# Patient Record
Sex: Male | Born: 1950 | Race: White | Hispanic: No | Marital: Single | Smoking: Current every day smoker
Health system: Southern US, Community
[De-identification: ages and names within clinical notes are randomized; demographics above are authoritative.]

## PROBLEM LIST (undated history)

## (undated) DIAGNOSIS — F32A Depression, unspecified: Secondary | ICD-10-CM

## (undated) DIAGNOSIS — F329 Major depressive disorder, single episode, unspecified: Secondary | ICD-10-CM

## (undated) DIAGNOSIS — J449 Chronic obstructive pulmonary disease, unspecified: Secondary | ICD-10-CM

## (undated) DIAGNOSIS — R0600 Dyspnea, unspecified: Secondary | ICD-10-CM

## (undated) DIAGNOSIS — G5603 Carpal tunnel syndrome, bilateral upper limbs: Secondary | ICD-10-CM

## (undated) DIAGNOSIS — F419 Anxiety disorder, unspecified: Secondary | ICD-10-CM

## (undated) DIAGNOSIS — R251 Tremor, unspecified: Secondary | ICD-10-CM

## (undated) DIAGNOSIS — R519 Headache, unspecified: Secondary | ICD-10-CM

## (undated) DIAGNOSIS — C719 Malignant neoplasm of brain, unspecified: Secondary | ICD-10-CM

## (undated) DIAGNOSIS — C3490 Malignant neoplasm of unspecified part of unspecified bronchus or lung: Secondary | ICD-10-CM

## (undated) DIAGNOSIS — M1990 Unspecified osteoarthritis, unspecified site: Secondary | ICD-10-CM

## (undated) DIAGNOSIS — Z9981 Dependence on supplemental oxygen: Secondary | ICD-10-CM

## (undated) DIAGNOSIS — R51 Headache: Secondary | ICD-10-CM

## (undated) DIAGNOSIS — R569 Unspecified convulsions: Secondary | ICD-10-CM

## (undated) DIAGNOSIS — K589 Irritable bowel syndrome without diarrhea: Secondary | ICD-10-CM

## (undated) DIAGNOSIS — R42 Dizziness and giddiness: Secondary | ICD-10-CM

## (undated) DIAGNOSIS — K219 Gastro-esophageal reflux disease without esophagitis: Secondary | ICD-10-CM

## (undated) DIAGNOSIS — K5790 Diverticulosis of intestine, part unspecified, without perforation or abscess without bleeding: Secondary | ICD-10-CM

## (undated) DIAGNOSIS — G629 Polyneuropathy, unspecified: Secondary | ICD-10-CM

## (undated) DIAGNOSIS — E538 Deficiency of other specified B group vitamins: Secondary | ICD-10-CM

## (undated) DIAGNOSIS — G4730 Sleep apnea, unspecified: Secondary | ICD-10-CM

## (undated) HISTORY — PX: BACK SURGERY: SHX140

## (undated) HISTORY — PX: TONSILLECTOMY: SUR1361

---

## 1999-02-22 DIAGNOSIS — R569 Unspecified convulsions: Secondary | ICD-10-CM

## 1999-02-22 HISTORY — DX: Unspecified convulsions: R56.9

## 2005-04-14 ENCOUNTER — Ambulatory Visit: Payer: Self-pay | Admitting: Family Medicine

## 2005-06-13 ENCOUNTER — Ambulatory Visit: Payer: Self-pay | Admitting: Gastroenterology

## 2006-06-19 ENCOUNTER — Ambulatory Visit: Payer: Self-pay | Admitting: Family Medicine

## 2006-09-08 ENCOUNTER — Ambulatory Visit: Payer: Self-pay | Admitting: Specialist

## 2007-12-04 ENCOUNTER — Ambulatory Visit: Payer: Self-pay | Admitting: Internal Medicine

## 2008-04-21 ENCOUNTER — Ambulatory Visit: Payer: Self-pay | Admitting: Internal Medicine

## 2008-05-26 ENCOUNTER — Ambulatory Visit: Payer: Self-pay | Admitting: Specialist

## 2008-09-22 ENCOUNTER — Ambulatory Visit: Payer: Self-pay | Admitting: Specialist

## 2009-03-17 ENCOUNTER — Ambulatory Visit: Payer: Self-pay | Admitting: Specialist

## 2009-08-21 ENCOUNTER — Ambulatory Visit: Payer: Self-pay | Admitting: Specialist

## 2009-11-27 ENCOUNTER — Ambulatory Visit: Payer: Self-pay | Admitting: Specialist

## 2010-06-15 ENCOUNTER — Ambulatory Visit: Payer: Self-pay | Admitting: Specialist

## 2016-10-02 ENCOUNTER — Encounter: Payer: Self-pay | Admitting: Emergency Medicine

## 2016-10-02 ENCOUNTER — Inpatient Hospital Stay: Payer: Medicare Other

## 2016-10-02 ENCOUNTER — Inpatient Hospital Stay
Admission: EM | Admit: 2016-10-02 | Discharge: 2016-10-05 | DRG: 189 | Disposition: A | Discharge status: Home / Self Care | Payer: Medicare Other | Attending: Internal Medicine | Admitting: Internal Medicine

## 2016-10-02 ENCOUNTER — Emergency Department: Payer: Medicare Other

## 2016-10-02 DIAGNOSIS — M549 Dorsalgia, unspecified: Secondary | ICD-10-CM | POA: Diagnosis present

## 2016-10-02 DIAGNOSIS — Z66 Do not resuscitate: Secondary | ICD-10-CM | POA: Diagnosis present

## 2016-10-02 DIAGNOSIS — Z820 Family history of epilepsy and other diseases of the nervous system: Secondary | ICD-10-CM

## 2016-10-02 DIAGNOSIS — K5900 Constipation, unspecified: Secondary | ICD-10-CM | POA: Diagnosis present

## 2016-10-02 DIAGNOSIS — G8929 Other chronic pain: Secondary | ICD-10-CM | POA: Diagnosis present

## 2016-10-02 DIAGNOSIS — Z515 Encounter for palliative care: Secondary | ICD-10-CM

## 2016-10-02 DIAGNOSIS — G4700 Insomnia, unspecified: Secondary | ICD-10-CM | POA: Diagnosis present

## 2016-10-02 DIAGNOSIS — J441 Chronic obstructive pulmonary disease with (acute) exacerbation: Secondary | ICD-10-CM | POA: Diagnosis present

## 2016-10-02 DIAGNOSIS — N400 Enlarged prostate without lower urinary tract symptoms: Secondary | ICD-10-CM | POA: Diagnosis present

## 2016-10-02 DIAGNOSIS — Z79899 Other long term (current) drug therapy: Secondary | ICD-10-CM

## 2016-10-02 DIAGNOSIS — R042 Hemoptysis: Secondary | ICD-10-CM | POA: Diagnosis present

## 2016-10-02 DIAGNOSIS — Z6826 Body mass index (BMI) 26.0-26.9, adult: Secondary | ICD-10-CM

## 2016-10-02 DIAGNOSIS — R079 Chest pain, unspecified: Secondary | ICD-10-CM

## 2016-10-02 DIAGNOSIS — F17210 Nicotine dependence, cigarettes, uncomplicated: Secondary | ICD-10-CM | POA: Diagnosis present

## 2016-10-02 DIAGNOSIS — Z880 Allergy status to penicillin: Secondary | ICD-10-CM

## 2016-10-02 DIAGNOSIS — Z7189 Other specified counseling: Secondary | ICD-10-CM

## 2016-10-02 DIAGNOSIS — E43 Unspecified severe protein-calorie malnutrition: Secondary | ICD-10-CM | POA: Diagnosis present

## 2016-10-02 DIAGNOSIS — R918 Other nonspecific abnormal finding of lung field: Secondary | ICD-10-CM

## 2016-10-02 DIAGNOSIS — Z7982 Long term (current) use of aspirin: Secondary | ICD-10-CM

## 2016-10-02 DIAGNOSIS — F329 Major depressive disorder, single episode, unspecified: Secondary | ICD-10-CM | POA: Diagnosis present

## 2016-10-02 DIAGNOSIS — J9601 Acute respiratory failure with hypoxia: Principal | ICD-10-CM | POA: Diagnosis present

## 2016-10-02 HISTORY — DX: Depression, unspecified: F32.A

## 2016-10-02 HISTORY — DX: Major depressive disorder, single episode, unspecified: F32.9

## 2016-10-02 HISTORY — DX: Chronic obstructive pulmonary disease, unspecified: J44.9

## 2016-10-02 LAB — BASIC METABOLIC PANEL
ANION GAP: 10 (ref 5–15)
BUN: 10 mg/dL (ref 6–20)
CALCIUM: 9.4 mg/dL (ref 8.9–10.3)
CHLORIDE: 90 mmol/L — AB (ref 101–111)
CO2: 34 mmol/L — AB (ref 22–32)
CREATININE: 0.61 mg/dL (ref 0.61–1.24)
GFR calc non Af Amer: >60 mL/min (ref >60)
Glucose, Bld: 102 mg/dL — ABNORMAL HIGH (ref 65–99)
Potassium: 3.8 mmol/L (ref 3.5–5.1)
SODIUM: 134 mmol/L — AB (ref 135–145)

## 2016-10-02 LAB — TROPONIN I

## 2016-10-02 LAB — CBC
HCT: 47.8 % (ref 40.0–52.0)
HEMOGLOBIN: 16.3 g/dL (ref 13.0–18.0)
MCH: 31.5 pg (ref 26.0–34.0)
MCHC: 34 g/dL (ref 32.0–36.0)
MCV: 92.7 fL (ref 80.0–100.0)
PLATELETS: 299 10*3/uL (ref 150–440)
RBC: 5.16 MIL/uL (ref 4.40–5.90)
RDW: 13.5 % (ref 11.5–14.5)
WBC: 7.7 10*3/uL (ref 3.8–10.6)

## 2016-10-02 MED ORDER — ACETAMINOPHEN 325 MG PO TABS: 650.0000 mg | ORAL_TABLET | Freq: Four times a day (QID) | ORAL | Status: DC | PRN

## 2016-10-02 MED ORDER — DULOXETINE HCL 30 MG PO CPEP
60.0000 mg | ORAL_CAPSULE | Freq: Every day | ORAL | Status: DC
  Administered 2016-10-02 – 2016-10-05 (×4): 60 mg via ORAL
  Filled 2016-10-02 (×4): qty 2

## 2016-10-02 MED ORDER — IPRATROPIUM-ALBUTEROL 0.5-2.5 (3) MG/3ML IN SOLN
3.0000 mL | Freq: Four times a day (QID) | RESPIRATORY_TRACT | Status: DC
  Administered 2016-10-02 – 2016-10-03 (×3): 3 mL via RESPIRATORY_TRACT
  Filled 2016-10-02 (×3): qty 3

## 2016-10-02 MED ORDER — IOPAMIDOL (ISOVUE-370) INJECTION 76%
75.0000 mL | Freq: Once | INTRAVENOUS | Status: AC | PRN
  Administered 2016-10-02: 75 mL via INTRAVENOUS

## 2016-10-02 MED ORDER — ONDANSETRON HCL 4 MG/2ML IJ SOLN: 4.0000 mg | Freq: Four times a day (QID) | INTRAMUSCULAR | Status: DC | PRN

## 2016-10-02 MED ORDER — METHYLPREDNISOLONE SODIUM SUCC 40 MG IJ SOLR
40.0000 mg | Freq: Four times a day (QID) | INTRAMUSCULAR | Status: DC
  Administered 2016-10-02 – 2016-10-04 (×7): 40 mg via INTRAVENOUS
  Filled 2016-10-02 (×7): qty 1

## 2016-10-02 MED ORDER — NICOTINE 21 MG/24HR TD PT24
21.0000 mg | MEDICATED_PATCH | Freq: Every day | TRANSDERMAL | Status: DC
  Administered 2016-10-02 – 2016-10-05 (×4): 21 mg via TRANSDERMAL
  Filled 2016-10-02 (×4): qty 1

## 2016-10-02 MED ORDER — MELOXICAM 7.5 MG PO TABS
7.5000 mg | ORAL_TABLET | Freq: Every day | ORAL | Status: DC
  Administered 2016-10-03 – 2016-10-05 (×4): 7.5 mg via ORAL
  Filled 2016-10-02 (×4): qty 1

## 2016-10-02 MED ORDER — MAGNESIUM SULFATE 2 GM/50ML IV SOLN
2.0000 g | Freq: Once | INTRAVENOUS | Status: AC
  Administered 2016-10-02: 2 g via INTRAVENOUS
  Filled 2016-10-02: qty 50

## 2016-10-02 MED ORDER — BUDESONIDE 0.5 MG/2ML IN SUSP
0.5000 mg | Freq: Two times a day (BID) | RESPIRATORY_TRACT | Status: DC
  Administered 2016-10-02 – 2016-10-05 (×6): 0.5 mg via RESPIRATORY_TRACT
  Filled 2016-10-02 (×6): qty 2

## 2016-10-02 MED ORDER — ASPIRIN EC 81 MG PO TBEC
81.0000 mg | DELAYED_RELEASE_TABLET | Freq: Every day | ORAL | Status: DC
  Administered 2016-10-02 – 2016-10-05 (×4): 81 mg via ORAL
  Filled 2016-10-02 (×4): qty 1

## 2016-10-02 MED ORDER — ACETAMINOPHEN 650 MG RE SUPP: 650.0000 mg | Freq: Four times a day (QID) | RECTAL | Status: DC | PRN

## 2016-10-02 MED ORDER — LORAZEPAM 1 MG PO TABS
1.0000 mg | ORAL_TABLET | Freq: Every day | ORAL | Status: DC
  Administered 2016-10-03 – 2016-10-04 (×3): 1 mg via ORAL
  Filled 2016-10-02 (×3): qty 1

## 2016-10-02 MED ORDER — METHYLPREDNISOLONE SODIUM SUCC 125 MG IJ SOLR
125.0000 mg | Freq: Once | INTRAMUSCULAR | Status: AC
  Administered 2016-10-02: 125 mg via INTRAVENOUS
  Filled 2016-10-02: qty 2

## 2016-10-02 MED ORDER — LEVOFLOXACIN 750 MG PO TABS
750.0000 mg | ORAL_TABLET | Freq: Once | ORAL | Status: AC
  Administered 2016-10-02: 750 mg via ORAL
  Filled 2016-10-02: qty 1

## 2016-10-02 MED ORDER — OXYCODONE-ACETAMINOPHEN 5-325 MG PO TABS
1.0000 | ORAL_TABLET | Freq: Four times a day (QID) | ORAL | Status: DC | PRN
  Administered 2016-10-03 – 2016-10-05 (×5): 1 via ORAL
  Filled 2016-10-02 (×5): qty 1

## 2016-10-02 MED ORDER — IPRATROPIUM-ALBUTEROL 0.5-2.5 (3) MG/3ML IN SOLN
3.0000 mL | Freq: Once | RESPIRATORY_TRACT | Status: AC
  Administered 2016-10-02: 3 mL via RESPIRATORY_TRACT
  Filled 2016-10-02: qty 3

## 2016-10-02 MED ORDER — DOXAZOSIN MESYLATE 4 MG PO TABS
4.0000 mg | ORAL_TABLET | Freq: Every day | ORAL | Status: DC
  Administered 2016-10-02 – 2016-10-05 (×4): 4 mg via ORAL
  Filled 2016-10-02 (×4): qty 1

## 2016-10-02 MED ORDER — FINASTERIDE 5 MG PO TABS
5.0000 mg | ORAL_TABLET | Freq: Every day | ORAL | Status: DC
  Administered 2016-10-02 – 2016-10-05 (×4): 5 mg via ORAL
  Filled 2016-10-02 (×4): qty 1

## 2016-10-02 MED ORDER — ONDANSETRON HCL 4 MG PO TABS: 4.0000 mg | ORAL_TABLET | Freq: Four times a day (QID) | ORAL | Status: DC | PRN

## 2016-10-02 MED ORDER — ALBUTEROL SULFATE (2.5 MG/3ML) 0.083% IN NEBU
5.0000 mg | INHALATION_SOLUTION | Freq: Once | RESPIRATORY_TRACT | Status: AC
  Administered 2016-10-02: 5 mg via RESPIRATORY_TRACT
  Filled 2016-10-02: qty 6

## 2016-10-02 MED ORDER — FUROSEMIDE 10 MG/ML IJ SOLN
60.0000 mg | Freq: Once | INTRAMUSCULAR | Status: AC
  Administered 2016-10-02: 60 mg via INTRAVENOUS
  Filled 2016-10-02: qty 8

## 2016-10-02 MED ORDER — ENOXAPARIN SODIUM 40 MG/0.4ML ~~LOC~~ SOLN
40.0000 mg | SUBCUTANEOUS | Status: DC
  Administered 2016-10-02 – 2016-10-04 (×3): 40 mg via SUBCUTANEOUS
  Filled 2016-10-02 (×3): qty 0.4

## 2016-10-02 NOTE — Plan of Care (Signed)
Problem: Safety: Goal: Ability to remain free from injury will improve Outcome: Progressing On admission, 10/02/16 - pt has fallen more than 1 time in the last 6 months; High Fall Precautions

## 2016-10-02 NOTE — ED Triage Notes (Signed)
Pt c/o shortness of breath and intermittent chest pain for the past several days. Pt has hx of COPD and is a smoker. Pt c/o shortness of breath at rest and exertion. On arrival RA sats = 88%, placed on 3 L.

## 2016-10-02 NOTE — ED Notes (Signed)
ED Provider at bedside. 

## 2016-10-02 NOTE — ED Provider Notes (Signed)
Patient received in sign-out from Dr. Joni Fears.  Workup and evaluation pending laboratory evaluation.  Patient does have evidence of a persistent dyspnea speaking in short phrases with diminished air movement throughout. Does have some improvement after albuterol nebulizers. Patient will be given IV magnesium. Based on his shortness of breath with hypoxia do feel the patient will require admission to hospital.      Merlyn Lot, MD 10/02/16 [phone]

## 2016-10-02 NOTE — H&P (Signed)
[facility] NAME: Bradley Frederick    MR#:  [id number]  DATE OF BIRTH:  19-May-1950  DATE OF ADMISSION:  10/02/2016  PRIMARY CARE PHYSICIAN: Tracie Harrier, MD   REQUESTING/REFERRING PHYSICIAN: Dr. Merlyn Lot.  CHIEF COMPLAINT:   Chief Complaint  Patient presents with  . Shortness of Breath    HISTORY OF PRESENT ILLNESS:  Bradley Frederick  is a 66 y.o. male with a known history of COPD, depression, chronic back pain who presents to the hospital due to worsening shortness of breath cough. Patient said he's been having worsening shortness of breath and cough now for the past month progressively getting worse. He has significant shortness of breath even on minimal exertion at home now. He therefore came to the ER for further evaluation and was noted to be hypoxic with O2 sats in the mid 70s to 80s. He was noted to be in acute respiratory failure with hypoxia secondary to COPD exacerbation. Patient also admits to a cough which is productive intermittently with bloody sputum. He denies any night sweats or weight loss. He denies any previous history of tuberculosis. Given his worsening shortness of breath and hypoxemia hospitalist services were contacted further treatment and evaluation.  PAST MEDICAL HISTORY:   Past Medical History:  Diagnosis Date  . COPD (chronic obstructive pulmonary disease) ([place])   . Depression     PAST SURGICAL HISTORY:  History reviewed. No pertinent surgical history.  SOCIAL HISTORY:   Social History  Substance Use Topics  . Smoking status: Current Every Day Smoker    Packs/day: 2.00    Years: 50.00    Types: Cigarettes  . Smokeless tobacco: Not on file  . Alcohol use Yes     Comment: Socially     FAMILY HISTORY:   Family History  Problem Relation Age of Onset  . Dementia Mother   . Parkinson's disease Father     DRUG ALLERGIES:   Allergies  Allergen Reactions  . Penicillins     Childhood  reaction unknown reaction  .Has patient had a PCN reaction causing immediate rash, facial/tongue/throat swelling, SOB or lightheadedness with hypotension: Unknown Has patient had a PCN reaction causing severe rash involving mucus membranes or skin necrosis: Unknown Has patient had a PCN reaction that required hospitalization: Unknown Has patient had a PCN reaction occurring within the last 10 years: Unknown If all of the above answers are "NO", then may proceed with Cephalosporin use.     REVIEW OF SYSTEMS:   Review of Systems  Constitutional: Negative for fever and weight loss.  HENT: Negative for congestion, nosebleeds and tinnitus.   Eyes: Negative for blurred vision, double vision and redness.  Respiratory: Positive for cough, hemoptysis and shortness of breath.   Cardiovascular: Negative for chest pain, orthopnea, leg swelling and PND.  Gastrointestinal: Negative for abdominal pain, diarrhea, melena, nausea and vomiting.  Genitourinary: Negative for dysuria, hematuria and urgency.  Musculoskeletal: Negative for falls and joint pain.  Neurological: Negative for dizziness, tingling, sensory change, focal weakness, seizures, weakness and headaches.  Endo/Heme/Allergies: Negative for polydipsia. Does not bruise/bleed easily.  Psychiatric/Behavioral: Negative for depression and memory loss. The patient is not nervous/anxious.     MEDICATIONS AT HOME:   Prior to Admission medications   Medication Sig Start Date End Date Taking? Authorizing Provider  ADVAIR DISKUS 250-50 MCG/DOSE AEPB Inhale 1 puff into the lungs every 12 (twelve) hours. 09/27/16  Yes [provider]  aspirin EC 81  MG tablet Take 81 mg by mouth daily.   Yes [provider]  doxazosin (CARDURA) 4 MG tablet Take 4 mg by mouth daily. 08/31/16  Yes [provider]  DULoxetine (CYMBALTA) 60 MG capsule Take 60 mg by mouth daily. 07/12/16  Yes [provider]  finasteride (PROSCAR) 5 MG tablet  Take 5 mg by mouth daily. 08/31/16  Yes [provider]  VENTOLIN HFA 108 (90 Base) MCG/ACT inhaler Inhale 2 puffs into the lungs every 6 (six) hours as needed for wheezing. 07/08/16  Yes [provider]      VITAL SIGNS:  Blood pressure 138/65, pulse 99, temperature 98.4 F (36.9 C), temperature source Oral, resp. rate (!) 22, height 5\' 4"  (1.626 m), weight 81.6 kg (180 lb), SpO2 92 %.  PHYSICAL EXAMINATION:  Physical Exam  GENERAL:  66 y.o.-year-old patient lying in the bed in mild Resp. Distress.   EYES: Pupils equal, round, reactive to light and accommodation. No scleral icterus. Extraocular muscles intact.  HEENT: Head atraumatic, normocephalic. Oropharynx and nasopharynx clear. No oropharyngeal erythema, moist oral mucosa  NECK:  Supple, no jugular venous distention. No thyroid enlargement, no tenderness.  LUNGS: Prolonged inspiratory and expiratory phase, minimal wheezing b/l, No rales, rhonchi. No use of accessory muscles of respiration.  CARDIOVASCULAR: S1, S2 RRR. No murmurs, rubs, gallops, clicks.  ABDOMEN: Soft, nontender, nondistended. Bowel sounds present. No organomegaly or mass.  EXTREMITIES: No pedal edema, cyanosis, or clubbing. + 2 pedal & radial pulses b/l.   NEUROLOGIC: Cranial nerves II through XII are intact. No focal Motor or sensory deficits appreciated b/l PSYCHIATRIC: The patient is alert and oriented x 3.  SKIN: No obvious rash, lesion, or ulcer.   LABORATORY PANEL:   CBC  Recent Labs Lab 10/02/16 1351  WBC 7.7  HGB 16.3  HCT 47.8  PLT 299   ------------------------------------------------------------------------------------------------------------------  Chemistries   Recent Labs Lab 10/02/16 1351  NA 134*  K 3.8  CL 90*  CO2 34*  GLUCOSE 102*  BUN 10  CREATININE 0.61  CALCIUM 9.4   ------------------------------------------------------------------------------------------------------------------  Cardiac  Enzymes  Recent Labs Lab 10/02/16 1351  TROPONINI <0.03   ------------------------------------------------------------------------------------------------------------------  RADIOLOGY:  Dg Chest 2 View  Result Date: 10/02/2016 CLINICAL DATA:  Intermittent chest pain for the past week. Shortness of breath. Dizziness. Smoker. EXAM: CHEST  2 VIEW COMPARISON:  Chest CT dated 06/15/2010. FINDINGS: Normal sized heart. Clear lungs. Prominent pulmonary vasculature and interstitial markings with mild hyperexpansion of the lungs and mild diffuse peribronchial thickening. There are [employer] at the lung bases. Possible small posterior effusion. Multiple old, healed right rib fractures and old, healed left lateral eighth rib fracture. IMPRESSION: Changes of COPD and chronic bronchitis with pulmonary vascular congestion and mild interstitial pulmonary edema. Electronically Signed   By: Claudie Revering M.D.   On: 10/02/2016 14:28     IMPRESSION AND PLAN:   66 year old male with past medical history of COPD with ongoing tobacco abuse, depression, chronic back pain who presents to the hospital due to shortness of breath and cough.  1. Acute respiratory failure with hypoxia-secondary to COPD exacerbation. -We'll treat underlying COPD with IV steroids, scheduled DuoNeb nebs, Pulmicort nebs. Follow clinically. Patient will need to be assessed for home oxygen prior to discharge.  2. COPD exacerbation-secondary to ongoing tobacco abuse. Chest x-ray is negative for any evidence of focal pneumonia. -We'll treat the patient with IV steroids, scheduled DuoNeb's, Pulmicort nebs. Assist patient for home oxygen prior to discharge. Next  3. Hemoptysis-given patient's ongoing tobacco abuse and underlying COPD I will get a CTA chest to rule out possible lung cancer/pulmonary embolism.  4. Depression-continue Cymbalta.  5. BPH-continue Cardura, Proscar.    All the records are reviewed and case discussed with ED  provider. Management plans discussed with the patient, family and they are in agreement.  CODE STATUS: Full code  TOTAL TIME TAKING CARE OF THIS PATIENT: 45 minutes.    Henreitta Leber M.D on 10/02/2016 at 4:57 PM  Between 7am to 6pm - Pager - [phone]  After 6pm go to [email or web address] - password [email or web address] [facility]  Office  [phone]  CC: Primary care physician; Tracie Harrier, MD

## 2016-10-02 NOTE — ED Provider Notes (Signed)
[facility] Emergency Department Provider Note  ____________________________________________  Time seen: Approximately 3:01 PM  I have reviewed the triage vital signs and the nursing notes.   HISTORY  Chief Complaint Shortness of Breath    HPI Bradley Frederick is a 66 y.o. male who complains of worsening constant shortness of breath and intermittent diffuse chest tightness over the past 2-3 days. Patient has a history of COPD and long term smoking. Shortness of breath is present at rest, worse with exertion. He was noted by his son to have perioral cyanosis today as well. Patient feels very weak. He has a productive cough. Denies fever or chills or sweats. No weight changes. No alleviating factors, described as severe.     Past Medical History:  Diagnosis Date  . COPD (chronic obstructive pulmonary disease) ([place])   . Depression      There are no active problems to display for this patient.    History reviewed. No pertinent surgical history.   Prior to Admission medications   Not on File  Albuterol Ipratropium   Allergies Penicillins   History reviewed. No pertinent family history.  Social History Social History  Substance Use Topics  . Smoking status: Current Every Day Smoker    Packs/day: 1.00    Years: 50.00    Types: Cigarettes  . Smokeless tobacco: Not on file  . Alcohol use Not on file    Review of Systems  Constitutional:   No fever or chills.  ENT:   No sore throat. No rhinorrhea. Cardiovascular:   Positive as above for intermittent chest pain without syncope. Respiratory:   Positive shortness of breath and productive cough. Gastrointestinal:   Negative for abdominal pain, vomiting and diarrhea.  Musculoskeletal:   Negative for focal pain or swelling All other systems reviewed and are negative except as documented above in ROS and HPI.  ____________________________________________   PHYSICAL EXAM:  VITAL SIGNS: ED  Triage Vitals  Enc Vitals Group     BP 10/02/16 1354 (!) 146/77     Pulse Rate 10/02/16 1354 89     Resp 10/02/16 1354 (!) 22     Temp 10/02/16 1354 98.4 F (36.9 C)     Temp Source 10/02/16 1354 Oral     SpO2 10/02/16 1350 (!) 88 %     Weight 10/02/16 1349 180 lb (81.6 kg)     Height 10/02/16 1349 5\' 4"  (1.626 m)     Head Circumference --      Peak Flow --      Pain Score 10/02/16 1348 8     Pain Loc --      Pain Edu? --      Excl. in [place]? --     Vital signs reviewed, nursing assessments reviewed.   Constitutional:   Alert and oriented. Not in distress. Eyes:   No scleral icterus.  EOMI. No nystagmus. No conjunctival pallor. PERRL. ENT   Head:   Normocephalic and atraumatic.   Nose:   No congestion/rhinnorhea.    Mouth/Throat:   MMM, no pharyngeal erythema. No peritonsillar mass.    Neck:   No meningismus. Full ROM Hematological/Lymphatic/Immunilogical:   No cervical lymphadenopathy. Cardiovascular:   RRR. Symmetric bilateral radial and DP pulses.  No murmurs.  Respiratory:   Quite distant breath sounds diffusely. Bibasilar crackles. End expiratory wheezing. Prolonged expiratory phase. Tachypnea. No retractions. Gastrointestinal:   Soft and nontender. Non distended. There is no CVA tenderness.  No rebound, rigidity, or guarding. Genitourinary:  deferred Musculoskeletal:   Normal range of motion in all extremities. No joint effusions.  No lower extremity tenderness.  No edema. Neurologic:   Normal speech and language.  Motor grossly intact. No gross focal neurologic deficits are appreciated.  Skin:    Skin is warm, dry and intact. No rash noted.  No petechiae, purpura, or bullae.  ____________________________________________    LABS (pertinent positives/negatives) (all labs ordered are listed, but only abnormal results are displayed) Labs Reviewed  BASIC METABOLIC PANEL - Abnormal; Notable for the following:       Result Value   Sodium 134 (*)    Chloride  90 (*)    CO2 34 (*)    Glucose, Bld 102 (*)    All other components within normal limits  CBC  TROPONIN I  BLOOD GAS, VENOUS   ____________________________________________   EKG    ____________________________________________    RADIOLOGY  Dg Chest 2 View  Result Date: 10/02/2016 CLINICAL DATA:  Intermittent chest pain for the past week. Shortness of breath. Dizziness. Smoker. EXAM: CHEST  2 VIEW COMPARISON:  Chest CT dated 06/15/2010. FINDINGS: Normal sized heart. Clear lungs. Prominent pulmonary vasculature and interstitial markings with mild hyperexpansion of the lungs and mild diffuse peribronchial thickening. There are [employer] at the lung bases. Possible small posterior effusion. Multiple old, healed right rib fractures and old, healed left lateral eighth rib fracture. IMPRESSION: Changes of COPD and chronic bronchitis with pulmonary vascular congestion and mild interstitial pulmonary edema. Electronically Signed   By: Claudie Revering M.D.   On: 10/02/2016 14:28    ____________________________________________   PROCEDURES Procedures  ____________________________________________   INITIAL IMPRESSION / ASSESSMENT AND PLAN / ED COURSE  Pertinent labs & imaging results that were available during my care of the patient were reviewed by me and considered in my medical decision making (see chart for details).  Patient presents with shortness of breath, productive cough, wheezing, consistent with COPD exacerbation. Also has acute respiratory failure with hypoxia, 88% on room air, not previously on home oxygen.  Solu-Medrol, nebs in the ED. Chest x-ray overall unremarkable with some mild evidence of vascular congestion. Labs unremarkable, VBG unremarkable.   We'll reassess patient after nebs, check his room air oxygen saturation and ambulatory oxygen saturation. If normalized patient may be suitable for discharge home with an antibiotic and steroid course. If he remains  hypoxic, he will need to be hospitalized for further management.  Care signed out to Dr. Quentin Cornwall.      ____________________________________________   FINAL CLINICAL IMPRESSION(S) / ED DIAGNOSES  Final diagnoses:  COPD exacerbation ([place])  Acute respiratory failure with hypoxia ([place])      New Prescriptions   No medications on file     Portions of this note were generated with dragon dictation software. Dictation errors may occur despite best attempts at proofreading.    Carrie Mew, MD 10/02/16 [phone]

## 2016-10-02 NOTE — ED Notes (Signed)
Patient transported to CT 

## 2016-10-03 ENCOUNTER — Inpatient Hospital Stay: Payer: Medicare Other

## 2016-10-03 DIAGNOSIS — J441 Chronic obstructive pulmonary disease with (acute) exacerbation: Secondary | ICD-10-CM

## 2016-10-03 DIAGNOSIS — R918 Other nonspecific abnormal finding of lung field: Secondary | ICD-10-CM | POA: Diagnosis not present

## 2016-10-03 LAB — CBC
HEMATOCRIT: 48.3 % (ref 40.0–52.0)
Hemoglobin: 16.3 g/dL (ref 13.0–18.0)
MCH: 31.8 pg (ref 26.0–34.0)
MCHC: 33.7 g/dL (ref 32.0–36.0)
MCV: 94.4 fL (ref 80.0–100.0)
Platelets: 278 10*3/uL (ref 150–440)
RBC: 5.11 MIL/uL (ref 4.40–5.90)
RDW: 13.4 % (ref 11.5–14.5)
WBC: 3.4 10*3/uL — AB (ref 3.8–10.6)

## 2016-10-03 LAB — BASIC METABOLIC PANEL
Anion gap: 12 (ref 5–15)
BUN: 14 mg/dL (ref 6–20)
CHLORIDE: 87 mmol/L — AB (ref 101–111)
CO2: 36 mmol/L — ABNORMAL HIGH (ref 22–32)
Calcium: 8.9 mg/dL (ref 8.9–10.3)
Creatinine, Ser: 0.79 mg/dL (ref 0.61–1.24)
Glucose, Bld: 131 mg/dL — ABNORMAL HIGH (ref 65–99)
POTASSIUM: 4.2 mmol/L (ref 3.5–5.1)
SODIUM: 135 mmol/L (ref 135–145)

## 2016-10-03 MED ORDER — IPRATROPIUM-ALBUTEROL 0.5-2.5 (3) MG/3ML IN SOLN
3.0000 mL | RESPIRATORY_TRACT | Status: DC
  Administered 2016-10-03 – 2016-10-05 (×7): 3 mL via RESPIRATORY_TRACT
  Filled 2016-10-03 (×8): qty 3

## 2016-10-03 MED ORDER — MORPHINE SULFATE (PF) 2 MG/ML IV SOLN
2.0000 mg | INTRAVENOUS | Status: DC | PRN
  Administered 2016-10-03: 2 mg via INTRAVENOUS
  Filled 2016-10-03: qty 1

## 2016-10-03 MED ORDER — MORPHINE SULFATE (PF) 2 MG/ML IV SOLN
2.0000 mg | INTRAVENOUS | Status: DC | PRN
  Administered 2016-10-03: 1 mg via INTRAVENOUS
  Administered 2016-10-03 – 2016-10-04 (×2): 2 mg via INTRAVENOUS
  Filled 2016-10-03 (×3): qty 1

## 2016-10-03 MED ORDER — SODIUM CHLORIDE 0.9 % IV BOLUS (SEPSIS)
500.0000 mL | Freq: Once | INTRAVENOUS | Status: AC
  Administered 2016-10-03: 500 mL via INTRAVENOUS

## 2016-10-03 MED ORDER — ENSURE ENLIVE PO LIQD
237.0000 mL | Freq: Two times a day (BID) | ORAL | Status: DC
  Administered 2016-10-03: 237 mL via ORAL

## 2016-10-03 NOTE — Progress Notes (Signed)
Patient's output has decreased since yesterday, and patient stated that he has not been eating and drinking like he is normally able to do. Patient bladder scanned and only showed 20 mL. Dr. Verdell Carmine notified and gave verbal order for 500 mL bolus. Order placed and bolus started. Bradley Frederick

## 2016-10-03 NOTE — Progress Notes (Signed)
Pnt has rested this evening. No c/o pain or discomfort. When pnt was awakened at 0630 pnt reported sleeping well and needing to get the rest from last night. No concerns with SOB or pain since this RN assumed care. Pnt remains free of injury or falls. Bed low and locked, call bell in reach. Will continue to monitor and assess.

## 2016-10-03 NOTE — Consult Note (Signed)
Name: Bradley Frederick MRN: [id number] DOB: 1950-07-07     CONSULTATION DATE:10/03/2016  REFERRING MD : sudini CHIEF COMPLAINT:  Shortness of breath and hemoptysis   STUDIES:  CT of the chest 10/02/16 images reviewed on 10/03/16 Impression left lower lobe lung mass possible endobronchial disease   HISTORY OF PRESENT ILLNESS:  66 year old white male with a past medical history of extensive tobacco abuse almost 4 packs a day for the last 50 years Patient had progressive shortness of breath over the last several months along with chronic cough Patient subsequently developed hemoptysis which is submassive in nature and was admitted to the hospital for further evaluation  patient underwent CT scan of his chest which showed a large left hilar mass with possible endobronchial disease Patient also has chronic back pain which is impeding his breathing and patient feels very short of breath patient placed on oxygen therapy for relief  After further investigation and talking with patient he has had progressive dyspnea and exertion and shortness of breath that impeding his lifestyle and based on this assessment 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  I have discussed the CT scan findings with the patient and have notified the patient that the left lung mass is probable malignant and will need further investigation and diagnosis for assessment, however at this time with his increased work of breathing and his COPD exacerbation patient is at very high risk for intraoperative and post procedure, occasions including death   Patient states that he may not want procedure done and will follow-up tomorrow for further assessment and discussion At this time patient has made himself DNR/DNI  Patient also had a weight loss of 10-15 pounds over the last several months Patient continues to smoke approximately half a pack a day Patient started on IV steroids by the hospitalist    The Risks and  Benefits of the Bronchoscopy with EBUS were explained to patient/family and I have discussed the risk for acute bleeding, increased chance of infection, increased chance of respiratory failure and cardiac arrest and death. I have also explained to avoid all types of NSAIDs to decrease chance of bleeding, and to avoid food and drinks the midnight prior to procedure.  The procedure consists of a video camera with a light source to be placed and inserted  into the lungs to  look for abnormal tissue and to obtain tissue samples by using needle and biopsy tools.  The patient/family understand the risks and benefits and have agreed to proceed with procedure.   PAST MEDICAL HISTORY :   has a past medical history of COPD (chronic obstructive pulmonary disease) (HCC) and Depression.  has no past surgical history on file. Prior to Admission medications   Medication Sig Start Date End Date Taking? Authorizing Provider  ADVAIR DISKUS 250-50 MCG/DOSE AEPB Inhale 1 puff into the lungs every 12 (twelve) hours. 09/27/16  Yes [provider]  aspirin EC 81 MG tablet Take 81 mg by mouth daily.   Yes [provider]  doxazosin (CARDURA) 4 MG tablet Take 4 mg by mouth daily. 08/31/16  Yes [provider]  DULoxetine (CYMBALTA) 60 MG capsule Take 60 mg by mouth daily. 07/12/16  Yes [provider]  finasteride (PROSCAR) 5 MG tablet Take 5 mg by mouth daily. 08/31/16  Yes [provider]  VENTOLIN HFA 108 (90 Base) MCG/ACT inhaler Inhale 2 puffs into the lungs every 6 (six) hours as needed for wheezing. 07/08/16  Yes [provider]  Allergies  Allergen Reactions  . Penicillins     Childhood reaction unknown reaction  .Has patient had a PCN reaction causing immediate rash, facial/tongue/throat swelling, SOB or lightheadedness with hypotension: Unknown Has patient had a PCN reaction causing severe rash involving mucus membranes or skin necrosis: Unknown Has  patient had a PCN reaction that required hospitalization: Unknown Has patient had a PCN reaction occurring within the last 10 years: Unknown If all of the above answers are "NO", then may proceed with Cephalosporin use.   Surgical history Positive spinal surgery in the past  FAMILY HISTORY:  family history includes Dementia in his mother; Parkinson's disease in his father. SOCIAL HISTORY:  reports that he has been smoking Cigarettes.  He has a 100.00 pack-year smoking history. He does not have any smokeless tobacco history on file. He reports that he drinks alcohol. He reports that he does not use drugs.  REVIEW OF SYSTEMS:   Constitutional: Negative for fever, chills, positive weight loss, positive malaise/fatigue and diaphoresis.  HENT: Negative for hearing loss, ear pain, nosebleeds, congestion, sore throat, neck pain, tinnitus and ear discharge.   Eyes: Negative for blurred vision, double vision, photophobia, pain, discharge and redness.  Respiratory: + cough,+ hemoptysis, +shortness of breath, +wheezing and stridor.   Cardiovascular: Negative for chest pain, palpitations, orthopnea, claudication, leg swelling and PND.  Gastrointestinal: Negative for heartburn, nausea, vomiting, abdominal pain, diarrhea, constipation, blood in stool and melena.  Genitourinary: Negative for dysuria, urgency, frequency, hematuria and flank pain.  Musculoskeletal: +, back pain  Skin: Negative for itching and rash.  Neurological: Negative for dizziness, tingling, tremors, sensory change, speech change, focal weakness, seizures, loss of consciousness, weakness and headaches.  Endo/Heme/Allergies: Negative for environmental allergies and polydipsia. Does not bruise/bleed easily. ALL OTHER ROS ARE NEGATIVE    VITAL SIGNS: Temp:  [97.8 F (36.6 C)-98.4 F (36.9 C)] 98.3 F (36.8 C) (08/13 0534) Pulse Rate:  [54-99] 83 (08/13 1058) Resp:  [20-25] 20 (08/13 0534) BP: (118-146)/(51-81) 119/58 (08/13  1058) SpO2:  [88 %-98 %] 92 % (08/13 0748) Weight:  [167 lb (75.8 kg)-180 lb (81.6 kg)] 167 lb (75.8 kg) (08/12 1750)  Physical Examination:   GENERAL:+ fatigue HEAD: Normocephalic, atraumatic.  EYES: Pupils equal, round, reactive to light. Extraocular muscles intact. No scleral icterus.  MOUTH: Moist mucosal membrane.   EAR, NOSE, THROAT: Clear without exudates. No external lesions.  NECK: Supple. No thyromegaly. No nodules. No JVD.  PULMONARY:CTA B/L no wheezes, no crackles, no rhonchi CARDIOVASCULAR: S1 and S2. Regular rate and rhythm. No murmurs, rubs, or gallops. No edema.  GASTROINTESTINAL: Soft, nontender, nondistended. No masses. Positive bowel sounds.  MUSCULOSKELETAL: No swelling, clubbing, or edema. Range of motion full in all extremities.  NEUROLOGIC: Cranial nerves II through XII are intact. No gross focal neurological deficits.  SKIN: No ulceration, lesions, rashes, or cyanosis. Skin warm and dry. Turgor intact.  PSYCHIATRIC: Mood, affect within normal limits. The patient is awake, alert and oriented x 3. Insight, judgment intact.       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 presents with progressive shortness of breath over the last several months along with hemoptysis with a CT scan finding consistent with a large left hilar mass most likely related to underlying primary malignancy  At this time patient and daughter would like to discuss options of going to procedure and and they have decided to think about going through the procedure and will follow-up tomorrow  However at this time I have  explained to the patient and the daughter that this is a very high risk procedure especially in the setting of probable end-stage COPD patient is at high risk for intra-and postop pulmonary and cardiac complications  #1 continue oxygen as prescribed #2 continue steroids as prescribed #3 morphine as needed #4 bronchodilator therapy with Pulmicort and DuoNeb nebs as prescribed #5 Will follow-up with patient tomorrow and assess if he wants to proceed with diagnostic bronchoscopy   Patient/Family are satisfied with Plan of action and management. All questions answered  Corrin Parker, M.D.  [facility]  Medical Director [place] Director [facility] Cardio-Pulmonary Department

## 2016-10-03 NOTE — Progress Notes (Addendum)
[place] [facility] [place] NAME: Bradley Frederick    MR#:  [id number]  DATE OF BIRTH:  02-Apr-1950  SUBJECTIVE:  CHIEF COMPLAINT:   Chief Complaint  Patient presents with  . Shortness of Breath   Continues to have shortness of breath. Some blood-tinged sputum. Has left-sided pleuritic chest pain that started today. On coughing and taking a deep breath.  On 3 L oxygen.  REVIEW OF SYSTEMS:    Review of Systems  Constitutional: Positive for malaise/fatigue. Negative for chills and fever.  HENT: Negative for sore throat.   Eyes: Negative for blurred vision, double vision and pain.  Respiratory: Positive for cough, shortness of breath and wheezing. Negative for hemoptysis.   Cardiovascular: Positive for chest pain. Negative for palpitations, orthopnea and leg swelling.  Gastrointestinal: Negative for abdominal pain, constipation, diarrhea, heartburn, nausea and vomiting.  Genitourinary: Negative for dysuria and hematuria.  Musculoskeletal: Positive for back pain. Negative for joint pain.  Skin: Negative for rash.  Neurological: Positive for weakness. Negative for sensory change, speech change, focal weakness and headaches.  Endo/Heme/Allergies: Does not bruise/bleed easily.  Psychiatric/Behavioral: Negative for depression. The patient is not nervous/anxious.     DRUG ALLERGIES:   Allergies  Allergen Reactions  . Penicillins     Childhood reaction unknown reaction  .Has patient had a PCN reaction causing immediate rash, facial/tongue/throat swelling, SOB or lightheadedness with hypotension: Unknown Has patient had a PCN reaction causing severe rash involving mucus membranes or skin necrosis: Unknown Has patient had a PCN reaction that required hospitalization: Unknown Has patient had a PCN reaction occurring within the last 10 years: Unknown If all of the above answers are "NO", then may proceed with Cephalosporin use.     VITALS:  Blood  pressure (!) 119/58, pulse 83, temperature 98.3 F (36.8 C), temperature source Oral, resp. rate 20, height 5\' 6"  (1.676 m), weight 75.8 kg (167 lb), SpO2 92 %.  PHYSICAL EXAMINATION:   Physical Exam  GENERAL:  66 y.o.-year-old patient lying in the bed EYES: Pupils equal, round, reactive to light and accommodation. No scleral icterus. Extraocular muscles intact.  HEENT: Head atraumatic, normocephalic. Oropharynx and nasopharynx clear.  NECK:  Supple, no jugular venous distention. No thyroid enlargement, no tenderness.  LUNGS: Decreased breath sounds bilaterally with some wheezing. Left chest tenderness CARDIOVASCULAR: S1, S2 normal. No murmurs, rubs, or gallops.  ABDOMEN: Soft, nontender, nondistended. Bowel sounds present. No organomegaly or mass.  EXTREMITIES: No cyanosis, clubbing or edema b/l.    NEUROLOGIC: Cranial nerves II through XII are intact. No focal Motor or sensory deficits b/l.   PSYCHIATRIC: The patient is alert and oriented x 3.  SKIN: No obvious rash, lesion, or ulcer.   LABORATORY PANEL:   CBC  Recent Labs Lab 10/03/16 0528  WBC 3.4*  HGB 16.3  HCT 48.3  PLT 278   ------------------------------------------------------------------------------------------------------------------ Chemistries   Recent Labs Lab 10/03/16 0528  NA 135  K 4.2  CL 87*  CO2 36*  GLUCOSE 131*  BUN 14  CREATININE 0.79  CALCIUM 8.9   ------------------------------------------------------------------------------------------------------------------  Cardiac Enzymes  Recent Labs Lab 10/02/16 1351  TROPONINI <0.03   ------------------------------------------------------------------------------------------------------------------  RADIOLOGY:  Dg Chest 2 View  Result Date: 10/02/2016 CLINICAL DATA:  Intermittent chest pain for the past week. Shortness of breath. Dizziness. Smoker. EXAM: CHEST  2 VIEW COMPARISON:  Chest CT dated 06/15/2010. FINDINGS: Normal sized heart.  Clear lungs. Prominent pulmonary vasculature and interstitial markings with mild hyperexpansion of the  lungs and mild diffuse peribronchial thickening. There are [employer] at the lung bases. Possible small posterior effusion. Multiple old, healed right rib fractures and old, healed left lateral eighth rib fracture. IMPRESSION: Changes of COPD and chronic bronchitis with pulmonary vascular congestion and mild interstitial pulmonary edema. Electronically Signed   By: Claudie Revering M.D.   On: 10/02/2016 14:28   Ct Angio Chest Pe W Or Wo Contrast  Result Date: 10/02/2016 CLINICAL DATA:  Increasing shortness of breath over the past few days. The patient has had some shortness of breath for several months. Long time smoker. EXAM: CT ANGIOGRAPHY CHEST WITH CONTRAST TECHNIQUE: Multidetector CT imaging of the chest was performed using the standard protocol during bolus administration of intravenous contrast. Multiplanar CT image reconstructions and MIPs were obtained to evaluate the vascular anatomy. CONTRAST:  75 cc Isovue 370 COMPARISON:  Chest radiographs obtained earlier today. Chest CT dated 06/15/2010. FINDINGS: Cardiovascular: There is a very small, somewhat linear filling defect in the left upper lobe pulmonary artery branch. This is seen on image number 128 of series 5, coronal image number 83 of series 9 and sagittal image number 115 of series 10. Otherwise, normally opacified pulmonary arteries with no additional pulmonary arterial filling defects. Normal sized heart. No pericardial fluid. Atheromatous arterial calcifications, including the thoracic aorta. Mediastinum/Nodes: Mildly enlarged right hilar node with a short axis diameter of 1.2 cm on image number 54 of series 4. There is also a significantly larger left hilar nodal or central lung mass, measuring 3.4 x 2.8 cm on image number 66 of series 4. This measures 3.4 cm in length on coronal image number 61 of series 7. This encasing adjacent pulmonary  arteries and is causing moderate narrowing of the proximal left lower lobe bronchus, with a small protrusion into the bronchus. No enlarged mediastinal or axillary lymph nodes. Unremarkable esophagus and thyroid gland. Lungs/Pleura: Mild bilateral bullous changes. Multiple small patchy, irregular densities at the left lung base with somewhat of a tree-in-bud appearance. No pleural fluid. Upper Abdomen: Incompletely included adrenal glands with a stable appearance of the included portions without a discrete adrenal mass. Musculoskeletal: Lower cervical spine degenerative changes and mild thoracic spine degenerative changes. No evidence of bony metastatic disease. Old right rib fractures, some healed and some with nonunion. Review of the MIP images confirms the above findings. IMPRESSION: 1. 3.4 x 3.4 x 2.8 cm left hilar mass with pulmonary arterial encasement and invasion into the adjacent left lower lobe bronchus causing moderate narrowing of the bronchus. This has CT features most compatible with a primary lung carcinoma. 2. Mildly enlarged right hilar lymph node, suspicious for a metastatic node. 3. Multiple small irregular densities with somewhat of a tree-in-bud appearance in the left lower lobe. This could represent endobronchial or lymphatic spread of tumor, bronchiolitis or partial postobstructive changes. 4. Single small, linear filling defect and a left upper lobe pulmonary artery branch. This may represent a small area of fibrosis from previous pulmonary embolism. An acute pulmonary embolus is less likely but not excluded. 5. Mild bilateral centrilobular emphysema. 6. Calcified aortic atherosclerosis. Aortic Atherosclerosis (ICD10-I70.0) and Emphysema (ICD10-J43.9). Electronically Signed   By: Claudie Revering M.D.   On: 10/02/2016 18:02     ASSESSMENT AND PLAN:   66 year old male with past medical history of COPD with ongoing tobacco abuse, depression, chronic back pain who presents to the hospital due  to shortness of breath and cough.  * Acute COPD exacerbation with acute hypoxic respiratory failure -IV  steroids, Antibiotics - Scheduled Nebulizers - Inhalers -Wean O2 as tolerated - Consult pulmonary if no improvement Mild hemoptysis likely due to coughing.  * Left hilar 3 cm mass. Concerning for primary lung carcinoma Discussed with pulmonary Dr. Mortimer Fries. Will need bronchoscopy.  * Depression-continue Cymbalta.  * BPH-continue Cardura, Proscar.  All the records are reviewed and case discussed with Care Management/Social Workerr. Management plans discussed with the patient, family and they are in agreement.  CODE STATUS: DNR  DVT Prophylaxis: SCDs  TOTAL TIME TAKING CARE OF THIS PATIENT: 30 minutes.  POSSIBLE D/C IN 1-2 DAYS, DEPENDING ON CLINICAL CONDITION.  Hillary Bow R M.D on 10/03/2016 at 11:43 AM  Between 7am to 6pm - Pager - [phone]  After 6pm go to [email or web address] - password [email or web address] [facility]  Office  [phone]  CC: Primary care physician; Tracie Harrier, MD  Note: This dictation was prepared with Dragon dictation along with smaller phrase technology. Any transcriptional errors that result from this process are unintentional.

## 2016-10-03 NOTE — Care Management (Signed)
Order for CM consult. Lives alone and up until now, independent in all adls, denies issues accessing medical care, obtaining medications or with transportation.  Dr Ginette Pitman is not patient's primary physician.  Patient has an appointment next week with Dr Guadelupe Sabin in [facility] to get established with new PCP. Current need for supplemental oxygen is acute.  Admitted with significant hemoptysis and a mass found on chest CT.  Patient and his daughter are considering diagnostic bronchoscopy

## 2016-10-03 NOTE — Progress Notes (Signed)
Advance care planning  Discussed with patient with his healthcare power of attorney daughter at bedside. Patient at baseline gets short of breath with minimal ambulation. This is worse with this episode of COPD exacerbation. Made him aware of the CT scan findings of the left hilar mass and high suspicion for lung cancer. He does want further investigations and appropriate treatment. Consulted pulmonary. Discussed regarding CODE STATUS and patient mentions that he has chosen to be a DO NOT RESUSCITATE. He understands his prognosis. Continues to smoke. Orders entered regarding change in CODE STATUS.  Time spent 20 minutes on advance care planning.

## 2016-10-03 NOTE — Progress Notes (Signed)
Initial Nutrition Assessment  DOCUMENTATION CODES:   Severe malnutrition in context of acute illness/injury  INTERVENTION:  Recommend downgrading diet to dysphagia 3 (mechanical soft) with thin liquids. Patient reports his dentures do not fit well and he has difficulty chewing. If he ends up not wearing dentures at meals at all, may require further downgrade to dysphagia 2 (fine chop) with thin liquids. Will continue to monitor.  Provide Ensure Enlive po BID, each supplement provides 350 kcal and 20 grams of protein.  Discussed patient's increased needs for calories and protein in setting of COPD. Encouraged adequate intake of calories and protein at meals. Encouraged patient to continue drinking Ensure Enlive or another oral nutrition supplement at home after discharge to help meet increased needs and prevent further loss of body weight/lean body mass.  NUTRITION DIAGNOSIS:   Malnutrition (Severe) related to acute illness (acute exacerbation of COPD, left hilar mass concerning for malignancy) as evidenced by 5.7 percent weight loss over 1 month, moderate depletion of body fat, moderate depletions of muscle mass.  GOAL:   Patient will meet greater than or equal to 90% of their needs  MONITOR:   PO intake, Supplement acceptance, Labs, Weight trends, Skin, I & O's  REASON FOR ASSESSMENT:   Malnutrition Screening Tool    ASSESSMENT:   66 year old male with PMHx of COPD, depression, chronic back pain who presented with one month history of worsening shortness of breath and cough found to have acute exacerbation of COPD with acute hypoxic respiratory failure and left hilar 3 cm mass concerning for primary lung carcinoma.   -Patient assessed by Dr. Mortimer Fries today. CT chest found large left hilar mass with possible endobronchial disease. Concern for malignancy. Patient may not want diagnostic procedure done. Made himself DNR/DNI.  Spoke with patient and his daughter at bedside. Patient  reports he has had a decreased appetite for the past 3-4 weeks. He believes it is related to his increased difficulty breathing, and worsening pain (chronic back pain, also having pain in "ribs" per patient). He reports he has been eating the same foods he usually does, he is just eating less of them. Patient lives alone and does not cook for himself. He gets meals out or eats food prepared by family. He typically eats 2 meals daily plus snacks between meals. He may have hamburger steak, potato casserole, hot ham and cheese biscuit, chef salad, chicken salad, meatloaf, and vegetables such as squash. Patient reports he used to eat well. Noted on exam patient was edentulous. He has his dentures here, but he reports they do not fit well. He is amenable to having food chopped to improve intake. Patient is also amenable to drinking Ensure Enlive BID.  UBW 198 lbs. Limited weight history in chart. He was 193 lbs on 10/01/2015. RD obtained bed scale weight of 166.3 lbs. He has lost approximately 26.7 lbs (13.8% body weight) over the past year, which is not significant for time frame. Patient believes he has lost at least 10 lbs (5.7% body weight) over the past month, which is significant for time frame.  Meal Completion: 85% of breakfast this morning per chart (approximately 510 kcal and 18 grams of protein). However, patient only ordered gelatin for lunch today.  Medications reviewed and include: methylprednisolone 40 mg Q6hrs.  Labs reviewed: Chloride 87, CO2 36.   Nutrition-Focused physical exam completed. Findings are moderate fat depletion (orbital region, upper arm region, thoracic/lumbar region), moderate muscle depletion (temple region, clavicle bone region, clavicle/acromion bone  region, scapular bone region, anterior thigh region, patellar region; mild depletion of dorsal hand region and posterior calf region), and no edema. Patient is edentulous. He has his dentures here.  Discussed with RN.  Diet  Order:  Diet regular Room service appropriate? Yes; Fluid consistency: Thin  Skin:  Wound (see comment) (cracking to bilateral heels)  Last BM:  10/02/2016  Height:   Ht Readings from Last 1 Encounters:  10/02/16 5\' 6"  (1.676 m)    Weight:   Wt Readings from Last 1 Encounters:  10/03/16 166 lb 4.8 oz (75.4 kg)    Ideal Body Weight:  64.5 kg  BMI:  Body mass index is 26.84 kg/m.  Estimated Nutritional Needs:   Kcal:  1915-2200 (MSJ x 1.3-1.5)  Protein:  90-113 grams (1.2-1.5 grams/kg)  Fluid:  2.2 L/day (30 ml/kg)  EDUCATION NEEDS:   Education needs addressed  Willey Blade, MS, RD, LDN Pager: [phone] After Hours Pager: [phone]

## 2016-10-04 ENCOUNTER — Telehealth: Payer: Self-pay

## 2016-10-04 DIAGNOSIS — J441 Chronic obstructive pulmonary disease with (acute) exacerbation: Secondary | ICD-10-CM | POA: Diagnosis not present

## 2016-10-04 DIAGNOSIS — R918 Other nonspecific abnormal finding of lung field: Secondary | ICD-10-CM | POA: Diagnosis not present

## 2016-10-04 MED ORDER — METHYLPREDNISOLONE SODIUM SUCC 125 MG IJ SOLR
60.0000 mg | Freq: Two times a day (BID) | INTRAMUSCULAR | Status: DC
  Administered 2016-10-04 – 2016-10-05 (×2): 60 mg via INTRAVENOUS
  Filled 2016-10-04 (×2): qty 2

## 2016-10-04 MED ORDER — LEVOFLOXACIN 750 MG PO TABS
750.0000 mg | ORAL_TABLET | Freq: Every day | ORAL | Status: DC
  Administered 2016-10-04 – 2016-10-05 (×2): 750 mg via ORAL
  Filled 2016-10-04 (×2): qty 1

## 2016-10-04 NOTE — Telephone Encounter (Signed)
L MOM to call and schedule f/u

## 2016-10-04 NOTE — Progress Notes (Signed)
[facility] [place] NAME: Bradley Frederick    MR#:  [id number]  DATE OF BIRTH:  February 01, 1951  SUBJECTIVE:  CHIEF COMPLAINT:   Chief Complaint  Patient presents with  . Shortness of Breath   Still has SOB and on 3 L O2 Left chest pain, pleuritic  REVIEW OF SYSTEMS:    Review of Systems  Constitutional: Positive for malaise/fatigue. Negative for chills and fever.  HENT: Negative for sore throat.   Eyes: Negative for blurred vision, double vision and pain.  Respiratory: Positive for cough, shortness of breath and wheezing. Negative for hemoptysis.   Cardiovascular: Positive for chest pain. Negative for palpitations, orthopnea and leg swelling.  Gastrointestinal: Negative for abdominal pain, constipation, diarrhea, heartburn, nausea and vomiting.  Genitourinary: Negative for dysuria and hematuria.  Musculoskeletal: Positive for back pain. Negative for joint pain.  Skin: Negative for rash.  Neurological: Positive for weakness. Negative for sensory change, speech change, focal weakness and headaches.  Endo/Heme/Allergies: Does not bruise/bleed easily.  Psychiatric/Behavioral: Negative for depression. The patient is not nervous/anxious.     DRUG ALLERGIES:   Allergies  Allergen Reactions  . Penicillins     Childhood reaction unknown reaction  .Has patient had a PCN reaction causing immediate rash, facial/tongue/throat swelling, SOB or lightheadedness with hypotension: Unknown Has patient had a PCN reaction causing severe rash involving mucus membranes or skin necrosis: Unknown Has patient had a PCN reaction that required hospitalization: Unknown Has patient had a PCN reaction occurring within the last 10 years: Unknown If all of the above answers are "NO", then may proceed with Cephalosporin use.     VITALS:  Blood pressure (!) 103/58, pulse 67, temperature 98.3 F (36.8 C), temperature source Oral, resp. rate 20, height 5\' 6"  (1.676 m),  weight 75.4 kg (166 lb 4.8 oz), SpO2 93 %.  PHYSICAL EXAMINATION:   Physical Exam  GENERAL:  66 y.o.-year-old patient lying in the bed EYES: Pupils equal, round, reactive to light and accommodation. No scleral icterus. Extraocular muscles intact.  HEENT: Head atraumatic, normocephalic. Oropharynx and nasopharynx clear.  NECK:  Supple, no jugular venous distention. No thyroid enlargement, no tenderness.  LUNGS: Decreased breath sounds bilaterally with some wheezing. Left chest tenderness CARDIOVASCULAR: S1, S2 normal. No murmurs, rubs, or gallops.  ABDOMEN: Soft, nontender, nondistended. Bowel sounds present. No organomegaly or mass.  EXTREMITIES: No cyanosis, clubbing or edema b/l.    NEUROLOGIC: Cranial nerves II through XII are intact. No focal Motor or sensory deficits b/l.   PSYCHIATRIC: The patient is alert and oriented x 3.  SKIN: No obvious rash, lesion, or ulcer.   LABORATORY PANEL:   CBC  Recent Labs Lab 10/03/16 0528  WBC 3.4*  HGB 16.3  HCT 48.3  PLT 278   ------------------------------------------------------------------------------------------------------------------ Chemistries   Recent Labs Lab 10/03/16 0528  NA 135  K 4.2  CL 87*  CO2 36*  GLUCOSE 131*  BUN 14  CREATININE 0.79  CALCIUM 8.9   ------------------------------------------------------------------------------------------------------------------  Cardiac Enzymes  Recent Labs Lab 10/02/16 1351  TROPONINI <0.03   ------------------------------------------------------------------------------------------------------------------  RADIOLOGY:  Dg Chest 2 View  Result Date: 10/03/2016 CLINICAL DATA:  Shortness of breath, left-sided chest pain for 4-5 days. EXAM: CHEST  2 VIEW COMPARISON:  CT chest 10/02/2016 FINDINGS: The lungs are hyperinflated likely secondary to COPD. There is left lower lobe airspace disease concerning for pneumonia. There is no pleural effusion or pneumothorax. The  heart size is normal. Left perihilar mass again noted.  The osseous structures are unremarkable. IMPRESSION: 1. Left lower lobe airspace disease concerning for pneumonia. 2. Left perihilar mass concerning for malignancy. Electronically Signed   By: Kathreen Devoid   On: 10/03/2016 13:22   Dg Chest 2 View  Result Date: 10/02/2016 CLINICAL DATA:  Intermittent chest pain for the past week. Shortness of breath. Dizziness. Smoker. EXAM: CHEST  2 VIEW COMPARISON:  Chest CT dated 06/15/2010. FINDINGS: Normal sized heart. Clear lungs. Prominent pulmonary vasculature and interstitial markings with mild hyperexpansion of the lungs and mild diffuse peribronchial thickening. There are [employer] at the lung bases. Possible small posterior effusion. Multiple old, healed right rib fractures and old, healed left lateral eighth rib fracture. IMPRESSION: Changes of COPD and chronic bronchitis with pulmonary vascular congestion and mild interstitial pulmonary edema. Electronically Signed   By: Claudie Revering M.D.   On: 10/02/2016 14:28   Ct Angio Chest Pe W Or Wo Contrast  Result Date: 10/02/2016 CLINICAL DATA:  Increasing shortness of breath over the past few days. The patient has had some shortness of breath for several months. Long time smoker. EXAM: CT ANGIOGRAPHY CHEST WITH CONTRAST TECHNIQUE: Multidetector CT imaging of the chest was performed using the standard protocol during bolus administration of intravenous contrast. Multiplanar CT image reconstructions and MIPs were obtained to evaluate the vascular anatomy. CONTRAST:  75 cc Isovue 370 COMPARISON:  Chest radiographs obtained earlier today. Chest CT dated 06/15/2010. FINDINGS: Cardiovascular: There is a very small, somewhat linear filling defect in the left upper lobe pulmonary artery branch. This is seen on image number 128 of series 5, coronal image number 83 of series 9 and sagittal image number 115 of series 10. Otherwise, normally opacified pulmonary arteries  with no additional pulmonary arterial filling defects. Normal sized heart. No pericardial fluid. Atheromatous arterial calcifications, including the thoracic aorta. Mediastinum/Nodes: Mildly enlarged right hilar node with a short axis diameter of 1.2 cm on image number 54 of series 4. There is also a significantly larger left hilar nodal or central lung mass, measuring 3.4 x 2.8 cm on image number 66 of series 4. This measures 3.4 cm in length on coronal image number 61 of series 7. This encasing adjacent pulmonary arteries and is causing moderate narrowing of the proximal left lower lobe bronchus, with a small protrusion into the bronchus. No enlarged mediastinal or axillary lymph nodes. Unremarkable esophagus and thyroid gland. Lungs/Pleura: Mild bilateral bullous changes. Multiple small patchy, irregular densities at the left lung base with somewhat of a tree-in-bud appearance. No pleural fluid. Upper Abdomen: Incompletely included adrenal glands with a stable appearance of the included portions without a discrete adrenal mass. Musculoskeletal: Lower cervical spine degenerative changes and mild thoracic spine degenerative changes. No evidence of bony metastatic disease. Old right rib fractures, some healed and some with nonunion. Review of the MIP images confirms the above findings. IMPRESSION: 1. 3.4 x 3.4 x 2.8 cm left hilar mass with pulmonary arterial encasement and invasion into the adjacent left lower lobe bronchus causing moderate narrowing of the bronchus. This has CT features most compatible with a primary lung carcinoma. 2. Mildly enlarged right hilar lymph node, suspicious for a metastatic node. 3. Multiple small irregular densities with somewhat of a tree-in-bud appearance in the left lower lobe. This could represent endobronchial or lymphatic spread of tumor, bronchiolitis or partial postobstructive changes. 4. Single small, linear filling defect and a left upper lobe pulmonary artery branch. This  may represent a small area of fibrosis from previous pulmonary  embolism. An acute pulmonary embolus is less likely but not excluded. 5. Mild bilateral centrilobular emphysema. 6. Calcified aortic atherosclerosis. Aortic Atherosclerosis (ICD10-I70.0) and Emphysema (ICD10-J43.9). Electronically Signed   By: Claudie Revering M.D.   On: 10/02/2016 18:02     ASSESSMENT AND PLAN:   66 year old male with past medical history of COPD with ongoing tobacco abuse, depression, chronic back pain who presents to the hospital due to shortness of breath and cough.  * Acute COPD exacerbation with acute hypoxic respiratory failure -IV steroids, Antibiotics - Scheduled Nebulizers - Inhalers -Wean O2 as tolerated Mild hemoptysis likely due to coughing is improving.  * Left hilar 3 cm mass. Concerning for primary lung carcinoma Discussed with pulmonary Dr. Mortimer Fries. Will need bronchoscopy as OP once COPD exacerbation improves  * Depression-continue Cymbalta.  * BPH-continue Cardura, Proscar.  All the records are reviewed and case discussed with Care Management/Social Worker Management plans discussed with the patient, family and they are in agreement.  CODE STATUS: DNR  DVT Prophylaxis: SCDs  TOTAL TIME TAKING CARE OF THIS PATIENT: 30 minutes.  POSSIBLE D/C IN 1-2 DAYS, DEPENDING ON CLINICAL CONDITION.  Hillary Bow R M.D on 10/04/2016 at 10:31 AM  Between 7am to 6pm - Pager - [phone]  After 6pm go to [email or web address] - password [email or web address] [facility]  Office  [phone]  CC: Primary care physician; Tracie Harrier, MD  Note: This dictation was prepared with Dragon dictation along with smaller phrase technology. Any transcriptional errors that result from this process are unintentional.

## 2016-10-04 NOTE — Telephone Encounter (Signed)
-----   Message from [place], [place] sent at 0/16/0109  8:50 AM EDT ----- Regarding: FW: lung mass follow up in 3-5 weeks Please schedule hosp f/u in 3-5 weeks with any provider for lung mass.  Thanks, Misty ----- Message ----- From: [name], MD Sent: 10/04/2016   8:39 AM To: Renelda Mom, LPN Subject: lung mass follow up in 3-5 weeks               Thanks!

## 2016-10-04 NOTE — Plan of Care (Signed)
Problem: Safety: Goal: Ability to remain free from injury will improve Outcome: Progressing Bed alarm on- pt remains free from injury this shift- family at bedside, call bell within reach  Problem: Pain Managment: Goal: General experience of comfort will improve Outcome: Progressing Pain medication administered as needed as ordered  Comments: Patient alert oriented, vitals signs stable- per report patient awaiting possible bronchoscopy related to CT results. On 2 Liters of oxygen at this time- Falls interventions in place

## 2016-10-04 NOTE — Consult Note (Signed)
Name: Bradley Frederick MRN: [id number] DOB: 07-15-1950     CONSULTATION DATE:10/04/2016  REFERRING MD : sudini CHIEF COMPLAINT:  Shortness of breath and hemoptysis   STUDIES:  CT of the chest 10/02/16 images reviewed on 10/03/16 Impression left lower lobe lung mass possible endobronchial disease   HISTORY OF PRESENT ILLNESS:  After further discussion with patient, he would like to wait on his decision for bronchoscopy His shortness of breath is much improved His pain has much improved patient received morphine for his pain  Patient is using oxygen as needed and is stable at this time   REVIEW OF SYSTEMS:   Constitutional: Negative for fever, chills, positive weight loss, positive malaise/fatigue and diaphoresis.  HENT: Negative for hearing loss, ear pain, nosebleeds, congestion, sore throat, neck pain, tinnitus and ear discharge.   Eyes: Negative for blurred vision, double vision, photophobia, pain, discharge and redness.  Respiratory: + cough,+ hemoptysis, +shortness of breath, +wheezing and stridor.   Cardiovascular: Negative for chest pain, palpitations, orthopnea, claudication, leg swelling and PND.  ALL OTHER ROS ARE NEGATIVE    VITAL SIGNS: Temp:  [97.7 F (36.5 C)-98.3 F (36.8 C)] 98.3 F (36.8 C) (08/14 0411) Pulse Rate:  [67-94] 67 (08/14 0411) Resp:  [20] 20 (08/14 0411) BP: (103-126)/(56-82) 103/58 (08/14 0411) SpO2:  [93 %-98 %] 93 % (08/14 0728) Weight:  [166 lb 4.8 oz (75.4 kg)] 166 lb 4.8 oz (75.4 kg) (08/13 1321)  Physical Examination:   GENERAL:+ fatigue HEAD: Normocephalic, atraumatic.  EYES: Pupils equal, round, reactive to light. Extraocular muscles intact. No scleral icterus.  MOUTH: Moist mucosal membrane.   EAR, NOSE, THROAT: Clear without exudates. No external lesions.  NECK: Supple. No thyromegaly. No nodules. No JVD.  PULMONARY:CTA B/L no wheezes, no crackles, no rhonchi CARDIOVASCULAR: S1 and S2. Regular rate and rhythm. No murmurs, rubs,  or gallops. No edema.       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 with bilateral emphysema presents with progressive shortness of breath over the last several months along with hemoptysis with a CT scan finding consistent with a large left hilar mass most likely related to underlying primary malignancy  At this time patient and daughter would like to discuss options of going to procedure and and they have decided to think about going through the procedure and will follow-up as outpatient  I have explained to the patient and the daughter that this is a very high risk procedure especially in the setting of probable end-stage COPD patient is at high risk for intra-and postop pulmonary and cardiac complications  #1 continue oxygen as prescribed #2 continue steroids as prescribed #3 morphine as needed #4 bronchodilator therapy with Pulmicort and DuoNeb nebs as prescribed #5 Will follow-up with patient as patient in 2-4 weeks to establish plan of care and whether or not to proceed with Bronchoscopy/EBUS   Patient satisfied with Plan of action and management. All questions answered Will sign off at this time  Corrin Parker, M.D.  [facility]  Medical Director [facility]  [facility] Cardio-Pulmonary Department

## 2016-10-05 DIAGNOSIS — R918 Other nonspecific abnormal finding of lung field: Secondary | ICD-10-CM | POA: Diagnosis not present

## 2016-10-05 DIAGNOSIS — Z515 Encounter for palliative care: Secondary | ICD-10-CM | POA: Diagnosis not present

## 2016-10-05 DIAGNOSIS — Z7189 Other specified counseling: Secondary | ICD-10-CM | POA: Diagnosis not present

## 2016-10-05 DIAGNOSIS — J9601 Acute respiratory failure with hypoxia: Secondary | ICD-10-CM | POA: Diagnosis not present

## 2016-10-05 LAB — BLOOD GAS, VENOUS
ACID-BASE EXCESS: 12.6 mmol/L — AB (ref 0.0–2.0)
Bicarbonate: 40.9 mmol/L — ABNORMAL HIGH (ref 20.0–28.0)
FIO2: 0.21
PCO2 VEN: 66 mmHg — AB (ref 44.0–60.0)
PH VEN: 7.4 (ref 7.250–7.430)
Patient temperature: 37
pO2, Ven: <31 mmHg — CL (ref 32.0–45.0)

## 2016-10-05 MED ORDER — IPRATROPIUM-ALBUTEROL 0.5-2.5 (3) MG/3ML IN SOLN
3.0000 mL | Freq: Four times a day (QID) | RESPIRATORY_TRACT | Status: DC
  Administered 2016-10-05: 14:00:00 3 mL via RESPIRATORY_TRACT
  Filled 2016-10-05: qty 3

## 2016-10-05 MED ORDER — NICOTINE 14 MG/24HR TD PT24: 14.0000 mg | MEDICATED_PATCH | Freq: Every day | TRANSDERMAL | 0 refills | Status: DC

## 2016-10-05 MED ORDER — OXYCODONE-ACETAMINOPHEN 5-325 MG PO TABS: 1.0000 | ORAL_TABLET | Freq: Four times a day (QID) | ORAL | 0 refills | Status: DC | PRN

## 2016-10-05 MED ORDER — PREDNISONE 50 MG PO TABS
50.0000 mg | ORAL_TABLET | Freq: Every day | ORAL | Status: DC
  Administered 2016-10-05: 50 mg via ORAL
  Filled 2016-10-05: qty 1

## 2016-10-05 MED ORDER — IPRATROPIUM-ALBUTEROL 0.5-2.5 (3) MG/3ML IN SOLN: 3.0000 mL | RESPIRATORY_TRACT | Status: DC | PRN

## 2016-10-05 MED ORDER — PREDNISONE 50 MG PO TABS
50.0000 mg | ORAL_TABLET | Freq: Every day | ORAL | 0 refills | Status: DC
Start: 2016-10-05 — End: 2016-11-02

## 2016-10-05 MED ORDER — LEVOFLOXACIN 750 MG PO TABS: 750.0000 mg | ORAL_TABLET | Freq: Every day | ORAL | 0 refills | Status: DC

## 2016-10-05 NOTE — Evaluation (Signed)
Physical Therapy Evaluation Patient Details Name: Bradley Frederick MRN: [id number] DOB: 05-Apr-1950 Today's Date: 10/05/2016   History of Present Illness  Bradley Frederick  is a 66 y.o. male with a known history of COPD, depression, chronic back pain who presents to the hospital due to worsening shortness of breath cough. Patient said he's been having worsening shortness of breath and cough now for the past month progressively getting worse. He has significant shortness of breath even on minimal exertion at home now. He therefore came to the ER for further evaluation and was noted to be hypoxic with O2 sats in the mid 70s to 80s. He was noted to be in acute respiratory failure with hypoxia secondary to COPD exacerbation. Patient also admits to a cough which is productive intermittently with bloody sputum. He denies any night sweats or weight loss. He denies any previous history of tuberculosis. Given his worsening shortness of breath and hypoxemia hospitalist services were contacted further treatment and evaluation. Pt is now admitted for acute COPD exacerbation and L hilar mass. He will follow-up with outpatient regarding mass and further diagnostics. At baseline pt mostly just performs transfers to/from power wheelchair.   Clinical Impression  Pt admitted with above diagnosis. Pt currently with functional limitations due to the deficits listed below (see PT Problem List).  Pt is modified independent for bed mobility and stand pivot transfers from bed to recliner. At baseline he mostly just transfers to/from power wheelchair and reports extremely limited ambulation over the last few years due to chronic pain. He is able to ambulate with short shuffling steps 10' in room with rolling walker and supplemental O2. Chair follow by daughter. Cues and instruction about sequencing with walker provided by therapist. Gait is slow and labored with DOE. At rest on 2L/min supplemental O2 SaO2 at 90%. At rest on room air SaO2  at 88%. On room air with activity SaO2 drops to 84%. Supplemental O2 reapplied and with pursed lip breathing pt requires 60-90 seconds for SaO2 to recover to 91%. SaO2 remains at or above 89% with activity on 3L/min O2. Pt would benefit from [facility] PT at discharge in order to improve his strength and function. He would also benefit from a rolling walker in order to perform limited ambulation with family/therapy. Pt will benefit from PT services to address deficits in strength, balance, and mobility in order to return to full function at home.      Follow Up Recommendations Home health PT    Equipment Recommendations  Rolling walker with 5" wheels    Recommendations for Other Services       Precautions / Restrictions Precautions Precautions: Fall Restrictions Weight Bearing Restrictions: No      Mobility  Bed Mobility Overal bed mobility: Modified Independent             General bed mobility comments: Use of bed rails and HOB elevated  Transfers Overall transfer level: Modified independent Equipment used: None             General transfer comment: Pt able to perform stand pivot transfer from bed to recliner with modified independent and reaching for arm rests of chair.   Ambulation/Gait Ambulation/Gait assistance: Min guard Ambulation Distance (Feet): 10 Feet Assistive device: Rolling walker (2 wheeled) Gait Pattern/deviations: Decreased step length - right;Decreased step length - left Gait velocity: Decreased Gait velocity interpretation: <1.8 ft/sec, indicative of risk for recurrent falls General Gait Details: Pt able to ambulate with short shuffling steps 10' in room  with rolling walker and supplemental O2. Chair follow by daughter. Cues and instruction about sequencing with walker provided by therapist. Gait is slow and labored with DOE  Stairs            Wheelchair Mobility    Modified Rankin (Stroke Patients Only)       Balance Overall balance  assessment: Needs assistance Sitting-balance support: No upper extremity supported Sitting balance-Leahy Scale: Fair     Standing balance support: No upper extremity supported Standing balance-Leahy Scale: Fair Standing balance comment: Able to remain standing without UE support but prefers to stabilize with support on walker                             Pertinent Vitals/Pain Pain Assessment: 0-10 Pain Score: 5  Pain Location: Chronic back and L shoulder pain Pain Intervention(s): Monitored during session    [place] expects to be discharged to:: Private residence Living Arrangements: Alone Available Help at Discharge: Family Type of Home: Apartment Home Access: Level entry     Home Layout: One level Home Equipment: Cane - single point;Shower seat;Grab bars - tub/shower;Grab bars - toilet;Other (comment);Wheelchair - power [employer] techn[employer], no walker)      Prior Function Level of Independence: Needs assistance   Gait / Transfers Assistance Needed: Pt mostly just performs transfers to/from power wheelchair secondary to chronic back pain. Reports very limited ambulation in the last few years and at most he says that he will take a few steps with his cane  ADL's / Homemaking Assistance Needed: Independent with ADLs, daughter assists patient with IADLs        Hand Dominance        Extremity/Trunk Assessment   Upper Extremity Assessment Upper Extremity Assessment: Generalized weakness    Lower Extremity Assessment Lower Extremity Assessment: Generalized weakness       Communication   Communication: No difficulties  Cognition Arousal/Alertness: Awake/alert Behavior During Therapy: WFL for tasks assessed/performed Overall Cognitive Status: Within Functional Limits for tasks assessed                                        General Comments      Exercises     Assessment/Plan    PT Assessment Patient needs continued PT  services  PT Problem List Decreased strength;Decreased activity tolerance;Decreased balance;Decreased mobility;Cardiopulmonary status limiting activity;Pain       PT Treatment Interventions DME instruction;Gait training;Therapeutic activities;Functional mobility training;Therapeutic exercise;Balance training;Neuromuscular re-education;Patient/family education;Wheelchair mobility training    PT Goals (Current goals can be found in the Care Plan section)  Acute Rehab PT Goals Patient Stated Goal: Return to prior function at home PT Goal Formulation: With patient Time For Goal Achievement: 10/19/16 Potential to Achieve Goals: Fair    Frequency Min 2X/week   Barriers to discharge Decreased caregiver support Lives alone    Co-evaluation               AM-PAC PT "6 Clicks" Daily Activity  Outcome Measure Difficulty turning over in bed (including adjusting bedclothes, sheets and blankets)?: None Difficulty moving from lying on back to sitting on the side of the bed? : A Little Difficulty sitting down on and standing up from a chair with arms (e.g., wheelchair, bedside commode, etc,.)?: A Little Help needed moving to and from a bed to chair (including a wheelchair)?: A  Little Help needed walking in hospital room?: A Lot Help needed climbing 3-5 steps with a railing? : Total 6 Click Score: 16    End of Session Equipment Utilized During Treatment: Gait belt;Oxygen Activity Tolerance: Patient tolerated treatment well Patient left: in chair;with call bell/phone within reach;with chair alarm set;with family/visitor present Nurse Communication: Mobility status;Other (comment) (MD and Care manager notified fo DC recommendations)      Time: [phone] PT Time Calculation (min) (ACUTE ONLY): 25 min   Charges:   PT Evaluation $PT Eval Low Complexity: 1 Low PT Treatments $Gait Training: 8-22 mins   PT G Codes:   PT G-Codes **NOT FOR INPATIENT CLASS** Functional Assessment Tool  Used: AM-PAC 6 Clicks Basic Mobility Functional Limitation: Mobility: Walking and moving around Mobility: Walking and Moving Around Current Status (M0802): At least 40 percent but less than 60 percent impaired, limited or restricted Mobility: Walking and Moving Around Goal Status [phone]): At least 20 percent but less than 40 percent impaired, limited or restricted    Phillips Grout PT, DPT    Huprich,Jason 10/05/2016, 12:11 PM

## 2016-10-05 NOTE — Care Management Important Message (Signed)
Important Message  Patient Details  Name: Bradley Frederick MRN: [id number] Date of Birth: May 10, 1950   Medicare Important Message Given:  Yes    Shelbie Ammons, RN 10/05/2016, 9:29 AM

## 2016-10-05 NOTE — Progress Notes (Signed)
Patient discharged home via private vehicle with daughter. Discharge instructions reviewed. Patient and daughter denied questions. IV removed without complications. Prescription included in discharge packet.

## 2016-10-05 NOTE — Discharge Instructions (Signed)
Oxygen 2 liters/min  Regular diet  Activity as tolerated

## 2016-10-05 NOTE — Consult Note (Signed)
Consultation Note Date: 10/05/2016   Patient Name: Bradley Frederick  DOB: Feb 20, 1951  MRN: [id number]  Age / Sex: 66 y.o., male  PCP: Tracie Harrier, MD Referring Physician: Hillary Bow, MD  Reason for Consultation: Establishing goals of care  HPI/Patient Profile: 66 y.o. male  with past medical history of COPD, depression who was admitted on 10/02/2016 with increased SOB secondary to COPD exacerbation.  He has had difficulty controlling his COPD at home using his normal medications and nebulizers. He reports that he can not get up and walk without become significant short of breath. He has had hemoptysis occasionally for the past 5 months. A CTA was done during this hospitilization that showed a left hilar mass, which is concerning for carcinoma. He will have a bronchoscopy done outpatient.  Clinical Assessment and Goals of Care:  I have reviewed medical records including EPIC notes, labs and imaging, received report from Dr. Darvin Neighbours, assessed the patient and then met at the bedside along with Lorriane Shire, the patients daughter,  to discuss diagnosis prognosis, GOC, EOL wishes, disposition and options. I discussed with them his options regarding palliative at home vs. hospice at home. He stated that if this mass shows cancer that he would like to pursue treatment if it is offered to him.   I introduced Palliative Medicine as specialized medical care for people living with serious illness. It focuses on providing relief from the symptoms and stress of a serious illness. The goal is to improve quality of life for both the patient and the family.  We discussed a brief life review of the patient. He was an Therapist, nutritional . The patient lives a lone in an apartment. He has an extensive smoking history but over the past couple years has cut down from 4 packs daily to about a half of a pack a day. He has a  daughter, son-in-law and ex wife who help care for him. They do grocery shopping, help him with his bills and do his laundry.   As far as functional status, he is unable to walk due to his COPD. He uses a wheelchair to get around at home. He cooks for himself. He reports that his appetite has decreased the past couple of months and that he has had a 26 lb unintentional weight loss since February of this year.    We discussed his current illness and what it means in the larger context of their on-going co-morbidities.  Natural disease trajectory and expectations at EOL were discussed.  He is aware that he will not improve from his COPD and that it is a chronic and progressive illness.   I attempted to elicit values and goals of care important to the patient.  The patient's daughter is his  HCPOA.  She states they have discussed what he wants in detail if he can no longer make decisions for himself.   I offered to the patient that my father always said - if I can't walk, and I can't wipe my  own butt then I would rather pass on peacefully.  The patient exclaimed, "there is a lot of wisdom in that - that is exactly how I feel".  The family feels that they need to hear from the oncologist and radiologist what the treatment options are and what the potential risks and side effects are.  Advanced directives, concepts specific to code status, artifical feeding and hydration, and rehospitalization were considered and discussed. The patient is a DNR.   His daughter is his HCPOA.    Hospice and Palliative Care services outpatient were explained and offered. They would like to wait on having Palliative Care or Hospice at home until after the work up is completed.  Questions and concerns were addressed.The family was encouraged to call with questions or concerns.      Primary Decision Maker:  PATIENT    SUMMARY OF RECOMMENDATIONS    DNR - Full scope treatment.  Wants to proceed with work up of  mass.  HCPOA is in place and she has a good idea of what his quality of life needs to be  Code Status/Advance Care Planning:  DNR    Symptom Management:    Patient complains of insomnia and mild constipation but is being discharged today.  Additional Recommendations (Limitations, Scope, Preferences):  Full Scope Treatment  Palliative Prophylaxis:   None   Psycho-social/Spiritual:   Desire for further Chaplaincy support: none at this time   Prognosis:   Months- due to possible lung mass and worsening COPD.     Discharge Planning: Home with Palliative Services      Primary Diagnoses: Present on Admission: . COPD exacerbation ([place])   I have reviewed the medical record, interviewed the patient and family, and examined the patient. The following aspects are pertinent.  Past Medical History:  Diagnosis Date  . COPD (chronic obstructive pulmonary disease) ([place])   . Depression    Social History   Social History  . Marital status: Single    Spouse name: N/A  . Number of children: N/A  . Years of education: N/A   Social History Main Topics  . Smoking status: Current Every Day Smoker    Packs/day: 2.00    Years: 50.00    Types: Cigarettes  . Smokeless tobacco: Former Systems developer  . Alcohol use Yes     Comment: Socially   . Drug use: No  . Sexual activity: Not Asked   Other Topics Concern  . None   Social History Narrative  . None   Family History  Problem Relation Age of Onset  . Dementia Mother   . Parkinson's disease Father    Scheduled Meds: . aspirin EC  81 mg Oral Daily  . budesonide (PULMICORT) nebulizer solution  0.5 mg Nebulization BID  . doxazosin  4 mg Oral Daily  . DULoxetine  60 mg Oral Daily  . enoxaparin (LOVENOX) injection  40 mg Subcutaneous Q24H  . feeding supplement (ENSURE ENLIVE)  237 mL Oral BID BM  . finasteride  5 mg Oral Daily  . ipratropium-albuterol  3 mL Nebulization Q6H  . levofloxacin  750 mg Oral Daily  . LORazepam  1  mg Oral QHS  . meloxicam  7.5 mg Oral Daily  . nicotine  21 mg Transdermal Daily  . predniSONE  50 mg Oral Q breakfast   Continuous Infusions: PRN Meds:.acetaminophen **OR** acetaminophen, ipratropium-albuterol, morphine injection, ondansetron **OR** ondansetron (ZOFRAN) IV, oxyCODONE-acetaminophen Allergies  Allergen Reactions  . Penicillins     Childhood reaction  unknown reaction  .Has patient had a PCN reaction causing immediate rash, facial/tongue/throat swelling, SOB or lightheadedness with hypotension: Unknown Has patient had a PCN reaction causing severe rash involving mucus membranes or skin necrosis: Unknown Has patient had a PCN reaction that required hospitalization: Unknown Has patient had a PCN reaction occurring within the last 10 years: Unknown If all of the above answers are "NO", then may proceed with Cephalosporin use.    Review of Systems mild constipation, weight loss, anorexia, insomnia.  Physical Exam Patient was sitting in his chair, appears comfortable.  CV: regular rate rhythm, difficult to hear heart sounds  Pulm: Quiet breath sounds. Abd: soft, reports some pain in LUQ.  Neuro: decreased plantar flexion and grip strength    Vital Signs: BP (!) 118/57 (BP Location: Left Arm)   Pulse 78   Temp 97.9 F (36.6 C) (Oral)   Resp (!) 22   Ht 5' 6"  (1.676 m)   Wt 75.4 kg (166 lb 4.8 oz) Comment: bed scale  SpO2 92%   BMI 26.84 kg/m  Pain Assessment: 0-10 POSS *See Group Information*: 1-Acceptable,Awake and alert Pain Score: 0-No pain   SpO2: SpO2: 92 % O2 Device:SpO2: 92 % O2 Flow Rate: .O2 Flow Rate (L/min): 2 L/min  IO: Intake/output summary:  Intake/Output Summary (Last 24 hours) at 10/05/16 1245 Last data filed at 10/05/16 1011  Gross per 24 hour  Intake              480 ml  Output              350 ml  Net              130 ml    LBM: Last BM Date: 10/02/16 Baseline Weight: Weight: 81.6 kg (180 lb) Most recent weight: Weight: 75.4 kg (166  lb 4.8 oz) (bed scale)     Palliative Assessment/Data: 60%      Time In: 12:00 Time Out: 1:10 Time Total: 70 min  Greater than 50%  of this time was spent counseling and coordinating care related to the above assessment and plan.  Signed by: Zonia Kief PA-S Florentina Jenny, PA-C Palliative Medicine Pager: [phone]  Please contact Palliative Medicine Team phone at [phone] for questions and concerns.  For individual provider: See Shea Evans

## 2016-10-05 NOTE — Plan of Care (Signed)
Problem: Health Behavior/Discharge Planning: Goal: Ability to manage health-related needs will improve Outcome: Progressing Continues on Solu-medrol IV Q 12 hrs and scheduled breathing treatments. No c/o pain nor respiratory distress noted. Continue to monitor

## 2016-10-05 NOTE — Care Management (Signed)
Physical therapy evaluation completed. Recommending home with home health, physical therapy, and rolling walker. Qualified for home oxygen. Nebulizer treatments have been done during this admission, but still needing oxygen per nasal cannula, Discussed Home Health agencies and durable medical equipment agencies. [place], Floydene Flock, Advanced representative updated. Discharge to home today per Dr. Darvin Neighbours. Daughter will transport Shelbie Ammons RN MSN CCM Care Management [phone]

## 2016-10-05 NOTE — Progress Notes (Signed)
SATURATION QUALIFICATIONS: (This note is used to comply with regulatory documentation for home oxygen)  Patient Saturations on Room Air at Rest = 88%  Patient Saturations on Room Air while Ambulating = 84%  Patient Saturations on 2 Liters of oxygen while Ambulating = 90%  Please briefly explain why patient needs home oxygen: COPD

## 2016-10-10 NOTE — Discharge Summary (Signed)
[facility] NAME: Bradley Frederick    MR#:  [id number]  DATE OF BIRTH:  01-Jan-1951  DATE OF ADMISSION:  10/02/2016 ADMITTING PHYSICIAN: Henreitta Leber, MD  DATE OF DISCHARGE: 10/05/2016  4:15 PM  PRIMARY CARE PHYSICIAN: Tracie Harrier, MD   ADMISSION DIAGNOSIS:  COPD exacerbation ([place]) [J44.1] Acute respiratory failure with hypoxia ([place]) [J96.01]  DISCHARGE DIAGNOSIS:  Active Problems:   COPD exacerbation ([facility])   Mass of left lung   Acute respiratory failure with hypoxia [facility])   Palliative care encounter   Goals of care, counseling/discussion   SECONDARY DIAGNOSIS:   Past Medical History:  Diagnosis Date  . COPD (chronic obstructive pulmonary disease) ([place])   . Depression      ADMITTING HISTORY   HISTORY OF PRESENT ILLNESS:  Bradley Frederick  is a 66 y.o. male with a known history of COPD, depression, chronic back pain who presents to the hospital due to worsening shortness of breath cough. Patient said he's been having worsening shortness of breath and cough now for the past month progressively getting worse. He has significant shortness of breath even on minimal exertion at home now. He therefore came to the ER for further evaluation and was noted to be hypoxic with O2 sats in the mid 70s to 80s. He was noted to be in acute respiratory failure with hypoxia secondary to COPD exacerbation. Patient also admits to a cough which is productive intermittently with bloody sputum. He denies any night sweats or weight loss. He denies any previous history of tuberculosis. Given his worsening shortness of breath and hypoxemia hospitalist services were contacted further treatment and evaluation.   HOSPITAL COURSE:   * Acute COPD exacerbation with acute hypoxic respiratory failure Patient was admitted to medical floor and treated with IV steroids and antibiotics. Scheduled nebulizers were used along with his home inhalers. Try to wean  oxygen but patient continued to need oxygen with saturations dropping into the 80s on room air. He was set up with home oxygen. Antibiotics changed to orally along with oral prednisone at discharge. Seen by pulmonary. Which improved with no wheezing at time of discharge.  * Left hilar 3 cm mass. 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 Consulted pulmonary and discussed with Dr. Mortimer Fries. Patient will follow-up with Dr. Mortimer Fries in 1 week and will be scheduled for bronchoscopy once his COPD exacerbation improves. He did have mild hemoptysis due to the hilar mass which has resolved.  * Other comorbidities remained stable and medications were unchanged.  Stable for discharge home. Patient will benefit from a palliative care service following at home.  CONSULTS OBTAINED:    DRUG ALLERGIES:   Allergies  Allergen Reactions  . Penicillins     Childhood reaction unknown reaction  .Has patient had a PCN reaction causing immediate rash, facial/tongue/throat swelling, SOB or lightheadedness with hypotension: Unknown Has patient had a PCN reaction causing severe rash involving mucus membranes or skin necrosis: Unknown Has patient had a PCN reaction that required hospitalization: Unknown Has patient had a PCN reaction occurring within the last 10 years: Unknown If all of the above answers are "NO", then may proceed with Cephalosporin use.     DISCHARGE MEDICATIONS:   Discharge Medication List as of 10/05/2016  3:29 PM    START taking these medications   Details  levofloxacin (LEVAQUIN) 750 MG tablet Take 1 tablet (750 mg total) by mouth daily., Starting Thu 10/06/2016, Normal    nicotine (  NICODERM CQ - DOSED IN MG/24 HOURS) 14 mg/24hr patch Place 1 patch (14 mg total) onto the skin daily., Starting Thu 10/06/2016, Normal    oxyCODONE-acetaminophen (PERCOCET/ROXICET) 5-325 MG tablet Take 1 tablet by mouth every 6 (six) hours as needed for severe pain., Starting Wed 10/05/2016, Print    predniSONE  (DELTASONE) 50 MG tablet Take 1 tablet (50 mg total) by mouth daily with breakfast., Starting Wed 10/05/2016, Normal      CONTINUE these medications which have NOT CHANGED   Details  ADVAIR DISKUS 250-50 MCG/DOSE AEPB Inhale 1 puff into the lungs every 12 (twelve) hours., Starting Tue 09/27/2016, Historical Med    aspirin EC 81 MG tablet Take 81 mg by mouth daily., Historical Med    doxazosin (CARDURA) 4 MG tablet Take 4 mg by mouth daily., Starting Wed 08/31/2016, Historical Med    DULoxetine (CYMBALTA) 60 MG capsule Take 60 mg by mouth daily., Starting Tue 07/12/2016, Historical Med    finasteride (PROSCAR) 5 MG tablet Take 5 mg by mouth daily., Starting Wed 08/31/2016, Historical Med    VENTOLIN HFA 108 (90 Base) MCG/ACT inhaler Inhale 2 puffs into the lungs every 6 (six) hours as needed for wheezing., Starting Fri 07/08/2016, Historical Med        Today   VITAL SIGNS:  Blood pressure (!) 118/57, pulse 78, temperature 97.9 F (36.6 C), temperature source Oral, resp. rate (!) 22, height 5\' 6"  (1.676 m), weight 75.4 kg (166 lb 4.8 oz), SpO2 94 %.  I/O:  No intake or output data in the 24 hours ending 10/10/16 1048  PHYSICAL EXAMINATION:  Physical Exam  GENERAL:  66 y.o.-year-old patient lying in the bed with no acute distress.  LUNGS: Normal breath sounds bilaterally, no wheezing, rales,rhonchi or crepitation. No use of accessory muscles of respiration.  CARDIOVASCULAR: S1, S2 normal. No murmurs, rubs, or gallops.  ABDOMEN: Soft, non-tender, non-distended. Bowel sounds present. No organomegaly or mass.  NEUROLOGIC: Moves all 4 extremities. PSYCHIATRIC: The patient is alert and oriented x 3.  SKIN: No obvious rash, lesion, or ulcer.   DATA REVIEW:   CBC No results for input(s): WBC, HGB, HCT, PLT in the last 168 hours.  Chemistries  No results for input(s): NA, K, CL, CO2, GLUCOSE, BUN, CREATININE, CALCIUM, MG, AST, ALT, ALKPHOS, BILITOT in the last 168 hours.  Invalid  input(s): GFRCGP  Cardiac Enzymes No results for input(s): TROPONINI in the last 168 hours.  Microbiology Results  No results found for this or any previous visit.  RADIOLOGY:  No results found.  Follow up with PCP in 1 week.  Management plans discussed with the patient, family and they are in agreement.  CODE STATUS:  Code Status History    Date Active Date Inactive Code Status Order ID Comments User Context   10/03/2016 11:42 AM 10/05/2016  7:37 PM DNR [id number]  Hillary Bow, MD Inpatient   10/02/2016  5:31 PM 10/03/2016 11:42 AM Full Code [id number]  Henreitta Leber, MD Inpatient    Questions for Most Recent Historical Code Status (Order [id number])    Question Answer Comment   In the event of cardiac or respiratory ARREST Do not call a "code blue"    In the event of cardiac or respiratory ARREST Do not perform Intubation, CPR, defibrillation or ACLS    In the event of cardiac or respiratory ARREST Use medication by any route, position, wound care, and other measures to relive pain and suffering. May use oxygen, suction and manual  treatment of airway obstruction as needed for comfort.         Advance Directive Documentation     Most Recent Value  Type of Advance Directive  Healthcare Power of Attorney  Pre-existing out of facility DNR order (yellow form or pink MOST form)  -  "MOST" Form in Place?  -      TOTAL TIME TAKING CARE OF THIS PATIENT ON DAY OF DISCHARGE: more than 30 minutes.   Hillary Bow R M.D on 10/10/2016 at 10:48 AM  Between 7am to 6pm - Pager - [phone]  After 6pm go to [email or web address] - password [email or web address] [facility]  Office  [phone]  CC: Primary care physician; Tracie Harrier, MD  Note: This dictation was prepared with Dragon dictation along with smaller phrase technology. Any transcriptional errors that result from this process are unintentional.

## 2016-11-02 ENCOUNTER — Encounter: Payer: Self-pay | Admitting: Internal Medicine

## 2016-11-02 ENCOUNTER — Ambulatory Visit (INDEPENDENT_AMBULATORY_CARE_PROVIDER_SITE_OTHER): Payer: Medicare Other | Admitting: Internal Medicine

## 2016-11-02 VITALS — BP 112/68 | HR 87 | Resp 16 | Ht 66.0 in | Wt 167.0 lb

## 2016-11-02 DIAGNOSIS — R918 Other nonspecific abnormal finding of lung field: Secondary | ICD-10-CM | POA: Diagnosis not present

## 2016-11-02 NOTE — Patient Instructions (Signed)
Will need EBUS/Bronchoscopy after October 7th Continue oxygen as presribed Continue inhalers as prescribed

## 2016-11-02 NOTE — Progress Notes (Signed)
Name: Bradley Frederick MRN: [id number] DOB: Nov 15, 1950     REFERRING MD : sudini CHIEF COMPLAINT:  Shortness of breath and hemoptysis   STUDIES:  CT of the chest 10/02/16 images reviewed on 11/02/16 Impression left lower lobe lung mass possible endobronchial disease   HISTORY OF PRESENT ILLNESS:  Patient was seen in the hospital for acute COPD exacerbation with hemoptysis CT of the chest found to have a left lower lobe lung mass consistent with primary lung cancer At that time patient did not decide on diagnostic procedure with bronchoscopy however has followed up today to address his concerns and will proceed with procedure with endobronchial ultrasound and bronchoscopy at this time   The Risks and Benefits of the Bronchoscopy with EBUS and Bronchoscopy were explained to patient/family and I have discussed the risk for acute bleeding, increased chance of infection, increased chance of respiratory failure and cardiac arrest and death and stroke. I have also explained to avoid all types of NSAIDs to decrease chance of bleeding, and to avoid food and drinks the midnight prior to procedure.  The procedure consists of a video camera with a light source to be placed and inserted  into the lungs to  look for abnormal tissue and to obtain tissue samples by using needle and biopsy tools.  The patient/family understand the risks and benefits and have agreed to proceed with procedure.   Patient advised to stop smoking Patient continues to use his oxygen as prescribed  patient continues to use inhalers as prescribed   No signs of exacerbation at this time No signs of acute CHF at this time  PAST MEDICAL HISTORY :   has a past medical history of COPD (chronic obstructive pulmonary disease) ([place]) and Depression.  has no past surgical history on file. Prior to Admission medications   Medication Sig Start Date End Date Taking? Authorizing Provider  ADVAIR DISKUS 250-50 MCG/DOSE AEPB  Inhale 1 puff into the lungs every 12 (twelve) hours. 09/27/16  Yes [provider]  aspirin EC 81 MG tablet Take 81 mg by mouth daily.   Yes [provider]  doxazosin (CARDURA) 4 MG tablet Take 4 mg by mouth daily. 08/31/16  Yes [provider]  finasteride (PROSCAR) 5 MG tablet Take 5 mg by mouth daily. 08/31/16  Yes [provider]  gabapentin (NEURONTIN) 300 MG capsule Take 300 mg by mouth 3 (three) times daily. 10/21/16  Yes [provider]  ipratropium-albuterol (DUONEB) 0.5-2.5 (3) MG/3ML SOLN Take 3 mLs by nebulization every 6 (six) hours as needed.   Yes [provider]  LORazepam (ATIVAN) 0.5 MG tablet Take 0.5 mg by mouth 2 (two) times daily. 10/21/16  Yes [provider]  nicotine (NICODERM CQ - DOSED IN MG/24 HOURS) 14 mg/24hr patch Place 1 patch (14 mg total) onto the skin daily. 10/06/16  Yes Sudini, Alveta Heimlich, MD  oxyCODONE-acetaminophen (PERCOCET/ROXICET) 5-325 MG tablet Take 1 tablet by mouth every 6 (six) hours as needed for severe pain. 10/05/16  Yes Sudini, Alveta Heimlich, MD  ranitidine (ZANTAC) 150 MG tablet Take 150 mg by mouth 2 (two) times daily. 10/21/16  Yes [provider]  sertraline (ZOLOFT) 100 MG tablet Take 100 mg by mouth daily. 10/21/16  Yes [provider]  tiZANidine (ZANAFLEX) 4 MG tablet Take 4 mg by mouth 3 (three) times daily. 10/21/16  Yes [provider]  VENTOLIN HFA 108 (90 Base) MCG/ACT inhaler Inhale 2 puffs into the lungs every 6 (six) hours as needed  for wheezing. 07/08/16  Yes [provider]   Allergies  Allergen Reactions  . Penicillins     Childhood reaction unknown reaction  .Has patient had a PCN reaction causing immediate rash, facial/tongue/throat swelling, SOB or lightheadedness with hypotension: Unknown Has patient had a PCN reaction causing severe rash involving mucus membranes or skin necrosis: Unknown Has patient had a PCN reaction that required  hospitalization: Unknown Has patient had a PCN reaction occurring within the last 10 years: Unknown If all of the above answers are "NO", then may proceed with Cephalosporin use.      REVIEW OF SYSTEMS:   Constitutional: Negative for fever, chills, weight loss, +malaise/fatigue and diaphoresis.  HENT: Negative for hearing loss, ear pain, nosebleeds, congestion, sore throat, neck pain, tinnitus and ear discharge.   Eyes: Negative for blurred vision, double vision, photophobia, pain, discharge and redness.  Respiratory: Negative for cough, hemoptysis, sputum production, +shortness of breath,- wheezing and stridor.   Cardiovascular: Negative for chest pain, palpitations, orthopnea, claudication, leg swelling and PND.  Gastrointestinal: Negative for heartburn, nausea, vomiting, abdominal pain, diarrhea, constipation, blood in stool and melena.  Genitourinary: Negative for dysuria, urgency, frequency, hematuria and flank pain.  Musculoskeletal: Negative for myalgias, back pain, joint pain and falls.  Skin: Negative for itching and rash.  Neurological: Negative for dizziness, tingling, tremors, sensory change, speech change, focal weakness, seizures, loss of consciousness, weakness and headaches.  Endo/Heme/Allergies: Negative for environmental allergies and polydipsia. Does not bruise/bleed easily.  ALL OTHER ROS ARE NEGATIVE    VITAL SIGNS: BP 112/68 (BP Location: Left Arm, Cuff Size: Normal)   Pulse 87   Resp 16   Ht 5\' 6"  (1.676 m)   Wt 167 lb (75.8 kg) Comment: hospital d/c pt unable to stand  SpO2 100%   BMI 26.95 kg/m    Physical Examination:   GENERAL:NAD, no fevers, chills, no weakness no fatigue HEAD: Normocephalic, atraumatic.  EYES: Pupils equal, round, reactive to light. Extraocular muscles intact. No scleral icterus.  MOUTH: Moist mucosal membrane.   EAR, NOSE, THROAT: Clear without exudates. No external lesions.  NECK: Supple. No thyromegaly. No nodules. No JVD.    PULMONARY:CTA B/L no wheezes, no crackles, no rhonchi CARDIOVASCULAR: S1 and S2. Regular rate and rhythm. No murmurs, rubs, or gallops. No edema.  GASTROINTESTINAL: Soft, nontender, nondistended. No masses. Positive bowel sounds.  MUSCULOSKELETAL: No swelling, clubbing, or edema. Range of motion full in all extremities.  NEUROLOGIC: Cranial nerves II through XII are intact. No gross focal neurological deficits.  SKIN: No ulceration, lesions, rashes, or cyanosis. Skin warm and dry. Turgor intact.  PSYCHIATRIC: Mood, affect within normal limits. The patient is awake, alert and oriented x 3. Insight, judgment intact.      ASSESSMENT / PLAN: 66 year old pleasant white male with extensive smoking history in the setting end-stage COPD with bilateral emphysema presents with progressive shortness of breath over the last several months along with hemoptysis with a CT scan finding consistent with a large left hilar mass most likely related to underlying primary malignancy  At this time patient and daughter would like to discuss options of going to procedure and and they have decided  going through the procedure.   I have explained to the patient and the daughter that this is a very high risk procedure especially in the setting of probable end-stage COPD patient is at high risk for intra-and postop pulmonary and cardiac complications-which include respiratory failure cardiac failure and stroke and death  #1 continue oxygen  as prescribed #2 bronchodilator therapy as prescribed #3 plan for EBUS and bronchoscopy as requested by patient and family after October 7   Patient/Family  satisfied with Plan of action and management. All questions answered   Corrin Parker, M.D.  [facility]  Medical Director [place] Director [facility] Cardio-Pulmonary Department

## 2016-11-22 ENCOUNTER — Encounter
Admission: RE | Admit: 2016-11-22 | Discharge: 2016-11-22 | Disposition: A | Discharge status: Home / Self Care | Payer: Medicare Other | Source: Ambulatory Visit | Attending: Internal Medicine | Admitting: Internal Medicine

## 2016-11-22 DIAGNOSIS — J449 Chronic obstructive pulmonary disease, unspecified: Secondary | ICD-10-CM | POA: Insufficient documentation

## 2016-11-22 DIAGNOSIS — Z01810 Encounter for preprocedural cardiovascular examination: Secondary | ICD-10-CM | POA: Insufficient documentation

## 2016-11-22 HISTORY — DX: Diverticulosis of intestine, part unspecified, without perforation or abscess without bleeding: K57.90

## 2016-11-22 HISTORY — DX: Headache: R51

## 2016-11-22 HISTORY — DX: Dizziness and giddiness: R42

## 2016-11-22 HISTORY — DX: Unspecified convulsions: R56.9

## 2016-11-22 HISTORY — DX: Carpal tunnel syndrome, bilateral upper limbs: G56.03

## 2016-11-22 HISTORY — DX: Gastro-esophageal reflux disease without esophagitis: K21.9

## 2016-11-22 HISTORY — DX: Dependence on supplemental oxygen: Z99.81

## 2016-11-22 HISTORY — DX: Sleep apnea, unspecified: G47.30

## 2016-11-22 HISTORY — DX: Irritable bowel syndrome, unspecified: K58.9

## 2016-11-22 HISTORY — DX: Dyspnea, unspecified: R06.00

## 2016-11-22 HISTORY — DX: Polyneuropathy, unspecified: G62.9

## 2016-11-22 HISTORY — DX: Anxiety disorder, unspecified: F41.9

## 2016-11-22 HISTORY — DX: Headache, unspecified: R51.9

## 2016-11-22 HISTORY — DX: Tremor, unspecified: R25.1

## 2016-11-22 HISTORY — DX: Unspecified osteoarthritis, unspecified site: M19.90

## 2016-11-22 HISTORY — DX: Deficiency of other specified B group vitamins: E53.8

## 2016-11-22 NOTE — Patient Instructions (Addendum)
Your procedure is scheduled on: December 01, 2016 (THURSDAY) Report to Same Day Surgery 2nd floor medical mall ([place] Entrance-take elevator on left to 2nd floor.  Check in with surgery information desk.) To find out your arrival time please call [phone] between 1PM - 3PM on November 30, 2016 [facility] )   Remember: Instructions that are not followed completely may result in serious medical risk, up to and including death, or upon the discretion of your surgeon and anesthesiologist your surgery may need to be rescheduled.    _x___ 1. Do not eat food after midnight the night before your procedure. You may drink clear liquids up to 2 hours before you are scheduled to arrive at the hospital for your procedure.  Do not drink clear liquids within 2 hours of your scheduled arrival to the hospital.  Clear liquids include  --Water or Apple juice without pulp  --Clear carbohydrate beverage such as ClearFast or Gatorade  --Black Coffee or Clear Tea (No milk, no creamers, do not add anything to                  the coffee or Tea Type 1 and type 2 diabetics should only drink water.  No gum chewing or hard candies.     __x__ 2. No Alcohol for 24 hours before or after surgery.   __x__3. No Smoking for 24 prior to surgery.   ____  4. Bring all medications with you on the day of surgery if instructed.    __x__ 5. Notify your doctor if there is any change in your medical condition     (cold, fever, infections).     Do not wear jewelry, make-up, hairpins, clips or nail polish.  Do not wear lotions, powders, or perfumes. You may wear deodorant.  Do not shave 48 hours prior to surgery. Men may shave face and neck.  Do not bring valuables to the hospital.    [facility] is not responsible for any belongings or valuables.               Contacts, dentures or bridgework may not be worn into surgery.  Leave your suitcase in the car. After surgery it may be brought to your room.  For patients  admitted to the hospital, discharge time is determined by your  treatment team                     Patients discharged the day of surgery will not be allowed to drive home.  You will need someone to drive you home and stay with you the night of your procedure.    Please read over the following fact sheets that you were given:   [facility] Preparing for Surgery and or MRSA Information   TAKE THE FOLLOWING MEDICATIONS WITH A SIP OF WATER THE MORNING OF SURGERY :  1. CARDURA  2. GABAPENTIN  3. LORAZEPAM  4. RANITIDINE  5. RANITIDINE AT BEDTIME ON OCTOBER 10  6. SERTRALINE  ____Fleets enema or Magnesium Citrate as directed.   __ Use CHG Soap or sage wipes as directed on instruction sheet   __X__ Use inhalers on the day of surgery and bring to hospital day of surgery (USE ADVAIR AND [place] )  ____ Stop Metformin and Janumet 2 days prior to surgery.    ____ Take 1/2 of usual insulin dose the night before surgery and none on the  morning surgery     _x___ Follow recommendations from Cardiologist, Pulmonologist or PCP regarding          stopping Aspirin, Coumadin, Plavix ,Eliquis, Effient, or Pradaxa, and Pletal. (PATIENT WAS INSTRUCTED TO STOP ASPIRIN ON OCTOBER  2 )  X____Stop Anti-inflammatories such as Advil, Aleve, Ibuprofen, Motrin, Naproxen, Naprosyn, Goodies powders or aspirin products. OK to take Tylenol and                          Celebrex.   _x___ Stop supplements until after surgery.  But may continue Vitamin D, Vitamin B, and multivitamin         ____ Bring C-Pap to the hospital.

## 2016-11-23 NOTE — Pre-Procedure Instructions (Addendum)
EKG OK BY DR P CARROLL 

## 2016-11-24 ENCOUNTER — Other Ambulatory Visit: Payer: Medicare Other

## 2016-12-01 ENCOUNTER — Ambulatory Visit: Payer: Medicare Other | Admitting: Anesthesiology

## 2016-12-01 ENCOUNTER — Ambulatory Visit
Admission: RE | Admit: 2016-12-01 | Discharge: 2016-12-01 | Disposition: A | Discharge status: Home / Self Care | Payer: Medicare Other | Source: Ambulatory Visit | Attending: Internal Medicine | Admitting: Internal Medicine

## 2016-12-01 ENCOUNTER — Encounter
Admission: RE | Disposition: A | Discharge status: Home / Self Care | Payer: Self-pay | Source: Ambulatory Visit | Attending: Internal Medicine

## 2016-12-01 ENCOUNTER — Encounter: Payer: Self-pay | Admitting: Internal Medicine

## 2016-12-01 DIAGNOSIS — G4730 Sleep apnea, unspecified: Secondary | ICD-10-CM | POA: Insufficient documentation

## 2016-12-01 DIAGNOSIS — K219 Gastro-esophageal reflux disease without esophagitis: Secondary | ICD-10-CM | POA: Insufficient documentation

## 2016-12-01 DIAGNOSIS — R0602 Shortness of breath: Secondary | ICD-10-CM | POA: Diagnosis not present

## 2016-12-01 DIAGNOSIS — F419 Anxiety disorder, unspecified: Secondary | ICD-10-CM | POA: Diagnosis not present

## 2016-12-01 DIAGNOSIS — Z7982 Long term (current) use of aspirin: Secondary | ICD-10-CM | POA: Diagnosis not present

## 2016-12-01 DIAGNOSIS — F17200 Nicotine dependence, unspecified, uncomplicated: Secondary | ICD-10-CM | POA: Diagnosis not present

## 2016-12-01 DIAGNOSIS — R918 Other nonspecific abnormal finding of lung field: Secondary | ICD-10-CM | POA: Diagnosis not present

## 2016-12-01 DIAGNOSIS — C3432 Malignant neoplasm of lower lobe, left bronchus or lung: Secondary | ICD-10-CM | POA: Diagnosis not present

## 2016-12-01 DIAGNOSIS — R042 Hemoptysis: Secondary | ICD-10-CM | POA: Insufficient documentation

## 2016-12-01 DIAGNOSIS — Z79899 Other long term (current) drug therapy: Secondary | ICD-10-CM | POA: Insufficient documentation

## 2016-12-01 DIAGNOSIS — F329 Major depressive disorder, single episode, unspecified: Secondary | ICD-10-CM | POA: Insufficient documentation

## 2016-12-01 DIAGNOSIS — Z880 Allergy status to penicillin: Secondary | ICD-10-CM | POA: Diagnosis not present

## 2016-12-01 DIAGNOSIS — J441 Chronic obstructive pulmonary disease with (acute) exacerbation: Secondary | ICD-10-CM | POA: Insufficient documentation

## 2016-12-01 HISTORY — PX: ENDOBRONCHIAL ULTRASOUND: SHX5096

## 2016-12-01 SURGERY — ENDOBRONCHIAL ULTRASOUND (EBUS)
Anesthesia: General

## 2016-12-01 MED ORDER — SUCCINYLCHOLINE CHLORIDE 20 MG/ML IJ SOLN
INTRAMUSCULAR | Status: AC
  Filled 2016-12-01: qty 1

## 2016-12-01 MED ORDER — ROCURONIUM BROMIDE 50 MG/5ML IV SOLN
INTRAVENOUS | Status: AC
  Filled 2016-12-01: qty 1

## 2016-12-01 MED ORDER — FENTANYL CITRATE (PF) 100 MCG/2ML IJ SOLN
INTRAMUSCULAR | Status: AC
  Filled 2016-12-01: qty 2

## 2016-12-01 MED ORDER — LACTATED RINGERS IV SOLN
INTRAVENOUS | Status: DC
  Administered 2016-12-01: 13:00:00 via INTRAVENOUS

## 2016-12-01 MED ORDER — PHENYLEPHRINE HCL 10 MG/ML IJ SOLN
INTRAMUSCULAR | Status: DC | PRN
  Administered 2016-12-01: 200 ug via INTRAVENOUS
  Administered 2016-12-01 (×5): 100 ug via INTRAVENOUS

## 2016-12-01 MED ORDER — FENTANYL CITRATE (PF) 100 MCG/2ML IJ SOLN
INTRAMUSCULAR | Status: DC | PRN
  Administered 2016-12-01: 100 ug via INTRAVENOUS

## 2016-12-01 MED ORDER — SUCCINYLCHOLINE CHLORIDE 20 MG/ML IJ SOLN
INTRAMUSCULAR | Status: DC | PRN
  Administered 2016-12-01: 100 mg via INTRAVENOUS

## 2016-12-01 MED ORDER — DEXAMETHASONE SODIUM PHOSPHATE 10 MG/ML IJ SOLN
INTRAMUSCULAR | Status: AC
  Filled 2016-12-01: qty 1

## 2016-12-01 MED ORDER — DEXAMETHASONE SODIUM PHOSPHATE 10 MG/ML IJ SOLN
INTRAMUSCULAR | Status: DC | PRN
  Administered 2016-12-01: 5 mg via INTRAVENOUS

## 2016-12-01 MED ORDER — MIDAZOLAM HCL 5 MG/5ML IJ SOLN
INTRAMUSCULAR | Status: DC | PRN
  Administered 2016-12-01: 2 mg via INTRAVENOUS

## 2016-12-01 MED ORDER — FENTANYL CITRATE (PF) 100 MCG/2ML IJ SOLN: 25.0000 ug | INTRAMUSCULAR | Status: DC | PRN

## 2016-12-01 MED ORDER — PROPOFOL 10 MG/ML IV BOLUS
INTRAVENOUS | Status: DC | PRN
  Administered 2016-12-01: 30 mg via INTRAVENOUS
  Administered 2016-12-01: 160 mg via INTRAVENOUS

## 2016-12-01 MED ORDER — MIDAZOLAM HCL 2 MG/2ML IJ SOLN
INTRAMUSCULAR | Status: AC
  Filled 2016-12-01: qty 2

## 2016-12-01 MED ORDER — ONDANSETRON HCL 4 MG/2ML IJ SOLN
INTRAMUSCULAR | Status: DC | PRN
  Administered 2016-12-01: 4 mg via INTRAVENOUS

## 2016-12-01 MED ORDER — ONDANSETRON HCL 4 MG/2ML IJ SOLN: 4.0000 mg | Freq: Once | INTRAMUSCULAR | Status: DC | PRN

## 2016-12-01 MED ORDER — LIDOCAINE HCL (CARDIAC) 20 MG/ML IV SOLN
INTRAVENOUS | Status: DC | PRN
  Administered 2016-12-01: 60 mg via INTRAVENOUS

## 2016-12-01 MED ORDER — ROCURONIUM BROMIDE 100 MG/10ML IV SOLN
INTRAVENOUS | Status: DC | PRN
  Administered 2016-12-01: 10 mg via INTRAVENOUS
  Administered 2016-12-01: 5 mg via INTRAVENOUS

## 2016-12-01 MED ORDER — SUGAMMADEX SODIUM 200 MG/2ML IV SOLN
INTRAVENOUS | Status: DC | PRN
  Administered 2016-12-01: 150 mg via INTRAVENOUS

## 2016-12-01 MED ORDER — PROPOFOL 10 MG/ML IV BOLUS
INTRAVENOUS | Status: AC
  Filled 2016-12-01: qty 20

## 2016-12-01 MED ORDER — ONDANSETRON HCL 4 MG/2ML IJ SOLN
INTRAMUSCULAR | Status: AC
  Filled 2016-12-01: qty 2

## 2016-12-01 NOTE — Op Note (Signed)
[facility] Patient Name: Bradley Frederick Procedure Date: 12/01/2016 1:08 PM MRN: [id number] Account #: [id number] Date of Birth: [birth year] Admit Type: Outpatient Age: 66 Room: [facility] PROCEDURE RM 01 Gender: Male Note Status: Finalized Attending MD: Ferd Hibbs , MD Procedure:         Bronchoscopy Indications:       Left lower lobe mass Providers:         Ferd Hibbs, MD Referring MD:       Medicines:         General Anesthesia Complications:     No immediate complications Procedure:         Pre-Anesthesia Assessment:                    - A History and Physical has been performed. The patient's                     medications, allergies and sensitivities have been                     reviewed.                    After obtaining informed consent, the bronchoscope was                     passed under direct vision. Throughout the procedure, the                     patient's blood pressure, pulse, and oxygen saturations                     were monitored continuously. the Bronchoscope was                     introduced through the mouth, via the endotracheal tube                     and advanced to the tracheobronchial tree of both lungs.                     The procedure was accomplished without difficulty. Findings:      Left Lung Abnormalities: A completely obstructing mass was found       proximally in the left lower lobe. The mass was large and bloody,       circumferential, endobronchial, exophytic, friable, fungating and       infiltrative. The lesion was not traversed. Brushings were obtained in       the left lower lobe and sent for routine cytology. Two samples were       obtained. Endobronchial biopsies were performed in the left lower lobe       using forceps and sent for routine cytology. Four samples were obtained. Impression:        - Left lower lobe mass                    - A bloody, circumferential, endobronchial, exophytic,          friable, fungating and infiltrative mass was found in the                     left lower lobe. This lesion is malignant.                    - No specimens collected.  Recommendation:    - Await biopsy, brushing and cytology results. Bradley Pesa MD Ferd Hibbs, MD 12/01/2016 1:31:40 PM This report has been signed electronically. Number of Addenda: 0 Note Initiated On: 12/01/2016 1:08 PM      [facility]

## 2016-12-01 NOTE — Interval H&P Note (Signed)
History and Physical Interval Note:  12/01/2016 12:23 PM  [name]  has presented today for surgery, with the diagnosis of LUNG MASS  The various methods of treatment have been discussed with the patient and family. After consideration of risks, benefits and other options for treatment, the patient has consented to  Procedure(s): ENDOBRONCHIAL ULTRASOUND (N/A) as a surgical intervention .  The patient's history has been reviewed, patient examined, no change in status, stable for surgery.  I have reviewed the patient's chart and labs.  Questions were answered to the patient's satisfaction.     [name]

## 2016-12-01 NOTE — Transfer of Care (Signed)
Immediate Anesthesia Transfer of Care Note  Patient: Bradley Frederick  Procedure(s) Performed: ENDOBRONCHIAL ULTRASOUND (N/A )  Patient Location: PACU  Anesthesia Type:General  Level of Consciousness: sedated  Airway & Oxygen Therapy: Patient Spontanous Breathing and Patient connected to face mask oxygen  Post-op Assessment: Report given to RN and Post -op Vital signs reviewed and stable  Post vital signs: Reviewed and stable  Last Vitals:  Vitals:   12/01/16 1225 12/01/16 1345  BP:  (!) (P) 110/58  Pulse:    Temp: 37 C (P) 36.4 C  SpO2:      Last Pain:  Vitals:   12/01/16 1225  TempSrc: Oral         Complications: No apparent anesthesia complications

## 2016-12-01 NOTE — Discharge Instructions (Signed)

## 2016-12-01 NOTE — Anesthesia Preprocedure Evaluation (Signed)
Anesthesia Evaluation  Patient identified by MRN, date of birth, ID band Patient awake    Reviewed: Allergy & Precautions, NPO status , Patient's Chart, lab work & pertinent test results  History of Anesthesia Complications Negative for: history of anesthetic complications  Airway Mallampati: II       Dental  (+) Edentulous Upper, Edentulous Lower   Pulmonary sleep apnea , COPD, Current Smoker,           Cardiovascular (-) hypertension(-) Past MI and (-) CHF (-) dysrhythmias (-) Valvular Problems/Murmurs     Neuro/Psych Seizures - (none for 10 years, off meds), Well Controlled,  Anxiety Depression    GI/Hepatic Neg liver ROS, GERD  Medicated and Poorly Controlled,  Endo/Other  neg diabetes  Renal/GU negative Renal ROS     Musculoskeletal   Abdominal   Peds  Hematology   Anesthesia Other Findings   Reproductive/Obstetrics                            Anesthesia Physical Anesthesia Plan  ASA: III  Anesthesia Plan: General   Post-op Pain Management:    Induction: Intravenous  PONV Risk Score and Plan: 1 and Ondansetron and Dexamethasone  Airway Management Planned: Oral ETT  Additional Equipment:   Intra-op Plan:   Post-operative Plan: Possible Post-op intubation/ventilation  Informed Consent: I have reviewed the patients History and Physical, chart, labs and discussed the procedure including the risks, benefits and alternatives for the proposed anesthesia with the patient or authorized representative who has indicated his/her understanding and acceptance.     Plan Discussed with:   Anesthesia Plan Comments:         Anesthesia Quick Evaluation

## 2016-12-01 NOTE — Anesthesia Procedure Notes (Signed)
Procedure Name: Intubation Date/Time: 12/01/2016 1:03 PM Performed by: Dionne Bucy Pre-anesthesia Checklist: Patient identified, Patient being monitored, Timeout performed, Emergency Drugs available and Suction available Patient Re-evaluated:Patient Re-evaluated prior to induction Oxygen Delivery Method: Circle system utilized Preoxygenation: Pre-oxygenation with 100% oxygen Induction Type: IV induction Ventilation: Mask ventilation without difficulty Laryngoscope Size: Mac and 4 Grade View: Grade I Tube type: Oral Tube size: 8.0 mm Number of attempts: 1 Airway Equipment and Method: Stylet Placement Confirmation: ETT inserted through vocal cords under direct vision,  positive ETCO2 and breath sounds checked- equal and bilateral Secured at: 21 cm Tube secured with: Tape Dental Injury: Teeth and Oropharynx as per pre-operative assessment

## 2016-12-01 NOTE — Anesthesia Postprocedure Evaluation (Signed)
Anesthesia Post Note  Patient: Bradley Frederick  Procedure(s) Performed: ENDOBRONCHIAL ULTRASOUND (N/A )  Patient location during evaluation: PACU Anesthesia Type: General Level of consciousness: awake and alert Pain management: pain level controlled Vital Signs Assessment: post-procedure vital signs reviewed and stable Respiratory status: spontaneous breathing and respiratory function stable Cardiovascular status: stable Anesthetic complications: no     Last Vitals:  Vitals:   12/01/16 1225 12/01/16 1345  BP:  (!) 110/58  Pulse:  98  Resp:  (!) 22  Temp: 37 C 36.4 C  SpO2:  99%    Last Pain:  Vitals:   12/01/16 1345  TempSrc:   PainSc: Asleep                 KEPHART,WILLIAM K

## 2016-12-01 NOTE — Anesthesia Post-op Follow-up Note (Signed)
Anesthesia QCDR form completed.        

## 2016-12-01 NOTE — OR Nursing (Signed)
Report Stacey RT

## 2016-12-01 NOTE — H&P (View-Only) (Signed)
Name: EAMONN SERMENO MRN: [id number] DOB: [birth year]     REFERRING MD : sudini CHIEF COMPLAINT:  Shortness of breath and hemoptysis   STUDIES:  CT of the chest 10/02/16 images reviewed on 11/02/16 Impression left lower lobe lung mass possible endobronchial disease   HISTORY OF PRESENT ILLNESS:  Patient was seen in the hospital for acute COPD exacerbation with hemoptysis CT of the chest found to have a left lower lobe lung mass consistent with primary lung cancer At that time patient did not decide on diagnostic procedure with bronchoscopy however has followed up today to address his concerns and will proceed with procedure with endobronchial ultrasound and bronchoscopy at this time   The Risks and Benefits of the Bronchoscopy with EBUS and Bronchoscopy were explained to patient/family and I have discussed the risk for acute bleeding, increased chance of infection, increased chance of respiratory failure and cardiac arrest and death and stroke. I have also explained to avoid all types of NSAIDs to decrease chance of bleeding, and to avoid food and drinks the midnight prior to procedure.  The procedure consists of a video camera with a light source to be placed and inserted  into the lungs to  look for abnormal tissue and to obtain tissue samples by using needle and biopsy tools.  The patient/family understand the risks and benefits and have agreed to proceed with procedure.   Patient advised to stop smoking Patient continues to use his oxygen as prescribed  patient continues to use inhalers as prescribed   No signs of exacerbation at this time No signs of acute CHF at this time  PAST MEDICAL HISTORY :   has a past medical history of COPD (chronic obstructive pulmonary disease) ([place]) and Depression.  has no past surgical history on file. Prior to Admission medications   Medication Sig Start Date End Date Taking? Authorizing Provider  ADVAIR DISKUS 250-50 MCG/DOSE AEPB  Inhale 1 puff into the lungs every 12 (twelve) hours. 09/27/16  Yes [provider]  aspirin EC 81 MG tablet Take 81 mg by mouth daily.   Yes [provider]  doxazosin (CARDURA) 4 MG tablet Take 4 mg by mouth daily. 08/31/16  Yes [provider]  finasteride (PROSCAR) 5 MG tablet Take 5 mg by mouth daily. 08/31/16  Yes [provider]  gabapentin (NEURONTIN) 300 MG capsule Take 300 mg by mouth 3 (three) times daily. 10/21/16  Yes [provider]  ipratropium-albuterol (DUONEB) 0.5-2.5 (3) MG/3ML SOLN Take 3 mLs by nebulization every 6 (six) hours as needed.   Yes [provider]  LORazepam (ATIVAN) 0.5 MG tablet Take 0.5 mg by mouth 2 (two) times daily. 10/21/16  Yes [provider]  nicotine (NICODERM CQ - DOSED IN MG/24 HOURS) 14 mg/24hr patch Place 1 patch (14 mg total) onto the skin daily. 10/06/16  Yes Sudini, Alveta Heimlich, MD  oxyCODONE-acetaminophen (PERCOCET/ROXICET) 5-325 MG tablet Take 1 tablet by mouth every 6 (six) hours as needed for severe pain. 10/05/16  Yes Sudini, Alveta Heimlich, MD  ranitidine (ZANTAC) 150 MG tablet Take 150 mg by mouth 2 (two) times daily. 10/21/16  Yes [provider]  sertraline (ZOLOFT) 100 MG tablet Take 100 mg by mouth daily. 10/21/16  Yes [provider]  tiZANidine (ZANAFLEX) 4 MG tablet Take 4 mg by mouth 3 (three) times daily. 10/21/16  Yes [provider]  VENTOLIN HFA 108 (90 Base) MCG/ACT inhaler Inhale 2 puffs into the lungs every 6 (six) hours as needed  for wheezing. 07/08/16  Yes [provider]   Allergies  Allergen Reactions  . Penicillins     Childhood reaction unknown reaction  .Has patient had a PCN reaction causing immediate rash, facial/tongue/throat swelling, SOB or lightheadedness with hypotension: Unknown Has patient had a PCN reaction causing severe rash involving mucus membranes or skin necrosis: Unknown Has patient had a PCN reaction that required  hospitalization: Unknown Has patient had a PCN reaction occurring within the last 10 years: Unknown If all of the above answers are "NO", then may proceed with Cephalosporin use.      REVIEW OF SYSTEMS:   Constitutional: Negative for fever, chills, weight loss, +malaise/fatigue and diaphoresis.  HENT: Negative for hearing loss, ear pain, nosebleeds, congestion, sore throat, neck pain, tinnitus and ear discharge.   Eyes: Negative for blurred vision, double vision, photophobia, pain, discharge and redness.  Respiratory: Negative for cough, hemoptysis, sputum production, +shortness of breath,- wheezing and stridor.   Cardiovascular: Negative for chest pain, palpitations, orthopnea, claudication, leg swelling and PND.  Gastrointestinal: Negative for heartburn, nausea, vomiting, abdominal pain, diarrhea, constipation, blood in stool and melena.  Genitourinary: Negative for dysuria, urgency, frequency, hematuria and flank pain.  Musculoskeletal: Negative for myalgias, back pain, joint pain and falls.  Skin: Negative for itching and rash.  Neurological: Negative for dizziness, tingling, tremors, sensory change, speech change, focal weakness, seizures, loss of consciousness, weakness and headaches.  Endo/Heme/Allergies: Negative for environmental allergies and polydipsia. Does not bruise/bleed easily.  ALL OTHER ROS ARE NEGATIVE    VITAL SIGNS: BP 112/68 (BP Location: Left Arm, Cuff Size: Normal)   Pulse 87   Resp 16   Ht 5\' 6"  (1.676 m)   Wt 167 lb (75.8 kg) Comment: hospital d/c pt unable to stand  SpO2 100%   BMI 26.95 kg/m    Physical Examination:   GENERAL:NAD, no fevers, chills, no weakness no fatigue HEAD: Normocephalic, atraumatic.  EYES: Pupils equal, round, reactive to light. Extraocular muscles intact. No scleral icterus.  MOUTH: Moist mucosal membrane.   EAR, NOSE, THROAT: Clear without exudates. No external lesions.  NECK: Supple. No thyromegaly. No nodules. No JVD.    PULMONARY:CTA B/L no wheezes, no crackles, no rhonchi CARDIOVASCULAR: S1 and S2. Regular rate and rhythm. No murmurs, rubs, or gallops. No edema.  GASTROINTESTINAL: Soft, nontender, nondistended. No masses. Positive bowel sounds.  MUSCULOSKELETAL: No swelling, clubbing, or edema. Range of motion full in all extremities.  NEUROLOGIC: Cranial nerves II through XII are intact. No gross focal neurological deficits.  SKIN: No ulceration, lesions, rashes, or cyanosis. Skin warm and dry. Turgor intact.  PSYCHIATRIC: Mood, affect within normal limits. The patient is awake, alert and oriented x 3. Insight, judgment intact.      ASSESSMENT / PLAN: 66 year old pleasant white male with extensive smoking history in the setting end-stage COPD with bilateral emphysema presents with progressive shortness of breath over the last several months along with hemoptysis with a CT scan finding consistent with a large left hilar mass most likely related to underlying primary malignancy  At this time patient and daughter would like to discuss options of going to procedure and and they have decided  going through the procedure.   I have explained to the patient and the daughter that this is a very high risk procedure especially in the setting of probable end-stage COPD patient is at high risk for intra-and postop pulmonary and cardiac complications-which include respiratory failure cardiac failure and stroke and death  #1 continue oxygen  as prescribed #2 bronchodilator therapy as prescribed #3 plan for EBUS and bronchoscopy as requested by patient and family after October 7   Patient/Family  satisfied with Plan of action and management. All questions answered   Corrin Parker, M.D.  [facility]  Medical Director [place] Director [facility] Cardio-Pulmonary Department

## 2016-12-02 ENCOUNTER — Telehealth: Payer: Self-pay | Admitting: *Deleted

## 2016-12-02 ENCOUNTER — Encounter: Payer: Self-pay | Admitting: *Deleted

## 2016-12-02 ENCOUNTER — Telehealth: Payer: Self-pay | Admitting: Oncology

## 2016-12-02 DIAGNOSIS — C3492 Malignant neoplasm of unspecified part of left bronchus or lung: Secondary | ICD-10-CM

## 2016-12-02 NOTE — Telephone Encounter (Signed)
-----   Message from [name], MD sent at 12/01/2016  2:28 PM EDT ----- Regarding: oncology referral Please make referral for Oncology ASAP Patient with new Left Lung cancer  Family requests PM appointments  Thank you

## 2016-12-02 NOTE — Progress Notes (Signed)
  Oncology Nurse Navigator Documentation  Navigator Location: [facility]Med Onc (12/02/16 1200) Referral date to RadOnc/MedOnc: 12/02/16 (12/02/16 1200) )Navigator Encounter Type: Introductory phone call (12/02/16 1200)   Abnormal Finding Date: 10/02/16 (12/02/16 1200)                     Barriers/Navigation Needs: Coordination of Care (12/02/16 1200)   Interventions: Coordination of Care (12/02/16 1200)   Coordination of Care: Appts (12/02/16 1200)        Acuity: Level 2 (12/02/16 1200)   Acuity Level 2: Initial guidance, education and coordination as needed;Educational needs;Assistance expediting appointments (12/02/16 1200)  phone call made to patient to introduce to navigator services and give new appt info. Pt given appt to see Dr. Janese Kunal on Wed 10/17 at 3pm. Contact info given and instructed to call if has any further questions or needs. Pt verbalized understanding and confirmed appt.    Time Spent with Patient: 15 (12/02/16 1200)

## 2016-12-02 NOTE — Telephone Encounter (Signed)
Consult for Lung Ca. Ref by Dr [name]. Notes in Epic. Appt conf with Hayley.  New patient forms left at Registration for patient to complete. Per Hayley/Lung Navigator, she will confirm with patient. Evening appt per patient request.

## 2016-12-05 ENCOUNTER — Inpatient Hospital Stay: Payer: Medicare Other | Admitting: Oncology

## 2016-12-05 LAB — CYTOLOGY - NON PAP

## 2016-12-07 ENCOUNTER — Encounter: Payer: Self-pay | Admitting: Oncology

## 2016-12-07 ENCOUNTER — Encounter: Payer: Self-pay | Admitting: *Deleted

## 2016-12-07 ENCOUNTER — Inpatient Hospital Stay: Payer: Medicare Other

## 2016-12-07 ENCOUNTER — Inpatient Hospital Stay: Payer: Medicare Other | Admitting: Oncology

## 2016-12-07 ENCOUNTER — Inpatient Hospital Stay: Payer: Medicare Other | Attending: Oncology | Admitting: Oncology

## 2016-12-07 VITALS — BP 101/64 | HR 94 | Temp 98.9°F | Resp 18 | Ht 63.5 in | Wt 154.0 lb

## 2016-12-07 DIAGNOSIS — Z8719 Personal history of other diseases of the digestive system: Secondary | ICD-10-CM | POA: Insufficient documentation

## 2016-12-07 DIAGNOSIS — G8929 Other chronic pain: Secondary | ICD-10-CM | POA: Insufficient documentation

## 2016-12-07 DIAGNOSIS — F329 Major depressive disorder, single episode, unspecified: Secondary | ICD-10-CM | POA: Insufficient documentation

## 2016-12-07 DIAGNOSIS — M129 Arthropathy, unspecified: Secondary | ICD-10-CM | POA: Diagnosis not present

## 2016-12-07 DIAGNOSIS — Z23 Encounter for immunization: Secondary | ICD-10-CM

## 2016-12-07 DIAGNOSIS — C3490 Malignant neoplasm of unspecified part of unspecified bronchus or lung: Secondary | ICD-10-CM

## 2016-12-07 DIAGNOSIS — Z79899 Other long term (current) drug therapy: Secondary | ICD-10-CM | POA: Insufficient documentation

## 2016-12-07 DIAGNOSIS — F17210 Nicotine dependence, cigarettes, uncomplicated: Secondary | ICD-10-CM | POA: Diagnosis not present

## 2016-12-07 DIAGNOSIS — K8020 Calculus of gallbladder without cholecystitis without obstruction: Secondary | ICD-10-CM | POA: Insufficient documentation

## 2016-12-07 DIAGNOSIS — G893 Neoplasm related pain (acute) (chronic): Secondary | ICD-10-CM | POA: Diagnosis not present

## 2016-12-07 DIAGNOSIS — K219 Gastro-esophageal reflux disease without esophagitis: Secondary | ICD-10-CM | POA: Diagnosis not present

## 2016-12-07 DIAGNOSIS — C7931 Secondary malignant neoplasm of brain: Secondary | ICD-10-CM | POA: Diagnosis not present

## 2016-12-07 DIAGNOSIS — G4730 Sleep apnea, unspecified: Secondary | ICD-10-CM | POA: Insufficient documentation

## 2016-12-07 DIAGNOSIS — T402X5S Adverse effect of other opioids, sequela: Secondary | ICD-10-CM | POA: Diagnosis not present

## 2016-12-07 DIAGNOSIS — R5383 Other fatigue: Secondary | ICD-10-CM | POA: Insufficient documentation

## 2016-12-07 DIAGNOSIS — K5903 Drug induced constipation: Secondary | ICD-10-CM | POA: Diagnosis not present

## 2016-12-07 DIAGNOSIS — E538 Deficiency of other specified B group vitamins: Secondary | ICD-10-CM | POA: Diagnosis not present

## 2016-12-07 DIAGNOSIS — R531 Weakness: Secondary | ICD-10-CM | POA: Insufficient documentation

## 2016-12-07 DIAGNOSIS — M549 Dorsalgia, unspecified: Secondary | ICD-10-CM | POA: Diagnosis not present

## 2016-12-07 DIAGNOSIS — J449 Chronic obstructive pulmonary disease, unspecified: Secondary | ICD-10-CM | POA: Diagnosis not present

## 2016-12-07 DIAGNOSIS — F419 Anxiety disorder, unspecified: Secondary | ICD-10-CM | POA: Insufficient documentation

## 2016-12-07 DIAGNOSIS — R634 Abnormal weight loss: Secondary | ICD-10-CM | POA: Insufficient documentation

## 2016-12-07 DIAGNOSIS — Z7982 Long term (current) use of aspirin: Secondary | ICD-10-CM | POA: Diagnosis not present

## 2016-12-07 DIAGNOSIS — I700 Atherosclerosis of aorta: Secondary | ICD-10-CM | POA: Diagnosis not present

## 2016-12-07 DIAGNOSIS — C3432 Malignant neoplasm of lower lobe, left bronchus or lung: Secondary | ICD-10-CM | POA: Diagnosis not present

## 2016-12-07 DIAGNOSIS — G629 Polyneuropathy, unspecified: Secondary | ICD-10-CM | POA: Diagnosis not present

## 2016-12-07 DIAGNOSIS — R918 Other nonspecific abnormal finding of lung field: Secondary | ICD-10-CM

## 2016-12-07 MED ORDER — PNEUMOCOCCAL 13-VAL CONJ VACC IM SUSP
0.5000 mL | Freq: Once | INTRAMUSCULAR | Status: AC
  Administered 2016-12-07: 0.5 mL via INTRAMUSCULAR
  Filled 2016-12-07: qty 0.5

## 2016-12-07 MED ORDER — INFLUENZA VAC SPLIT QUAD 0.5 ML IM SUSY
0.5000 mL | PREFILLED_SYRINGE | Freq: Once | INTRAMUSCULAR | Status: AC
  Administered 2016-12-07: 0.5 mL via INTRAMUSCULAR
  Filled 2016-12-07: qty 0.5

## 2016-12-07 NOTE — Progress Notes (Signed)
  Oncology Nurse Navigator Documentation  Navigator Location: [facility] Onc (12/07/16 1500)   )Navigator Encounter Type: Initial MedOnc (12/07/16 1500)   Abnormal Finding Date: 12/05/16 (12/07/16 1500)                     Barriers/Navigation Needs: Coordination of Care (12/07/16 1500)   Interventions: Coordination of Care (12/07/16 1500)   Coordination of Care: Appts;Radiology (12/07/16 1500)         met with patient during initial consultation with Dr. Janese Deagen. All questions answered at the time of visit. Assisted pt in coordinating appts. Pt and daughter given education material regarding diagnosis and support services at the cancer center. Upcoming appts reviewed with the patient's daughter. Instructions for PET scan reviewed with the patient and daughter. Informed the daughter that she will be notified by our office when port placement has been scheduled. Pt's daughter works full time and requests that appts be scheduled for after 2pm if at all possible. Informed daughter that we will try our best but some appts may have to be scheduled in the morning. Pt added to tumor board next week and foundation one testing has been ordered. Contact info given to pt and his daughter and instructed to call if has any further questions or needs. Understanding verbalized.           Time Spent with Patient: 60 (12/07/16 1500)

## 2016-12-07 NOTE — Progress Notes (Signed)
Hematology/Oncology Consult note [facility] Telephone:([phone] [phone]  Patient Care Team: Marygrace Drought, MD as PCP - General (Family Medicine) Telford Nab, RN as Registered Nurse   Name of the patient: Bradley Frederick  [id number]  Oct 29, 1950    Reason for referral- lung cancer   Referring physician- Dr. Mortimer Fries  Date of visit: 12/07/16   History of presenting illness- 1. Patient is a 66 year old male with a past medical history significant for COPD, depression and chronic back pain and neuropathy of unknown etiology. He recently was admitted to the hospital in August 2018 with worsening shortness of breath and cough.He was treated for COPD exacerbation and underwent CT chest at that time  2. CT chest showed 3.4 x 3.4 x 2.8 cm left hilar mass with pulmonary arterial encasement and invasion into the adjacent left lower bronchus causing moderate narrowing of the bronchus. Mildly enlarged right hilar lymph node suspicious for metastatic node.multiple small irregular densities with somewhat of a train but appearance in the left lower lobe. This could represent endobronchial or lymphatic spread of tumor, bronchiolitis or partial postobstructive changes. Small linear filling defect in the left upper lobe pulmonary arterial branch which may represent an area of fibrosis from the nearest PE. An acute PE is less likely.  3. Patient was discharged on 2 L of home oxygen and underwent bronchoscopy as an outpatient by Dr. Mortimer Fries. bronchoscopy showed a completely obstructing mass in the left lower lobe. Mass was large bloody circumferential endobronchial exophytic friable fungating an infiltrative.bronchoscopy brushings and endobronchial biopsy showed poorly differentiated carcinoma-non-small cell carcinoma. Differentiation between adenocarcinoma and squamous cell carcinoma could not be done  4. Patient has been referred to [place] for further management. He currently  lives alone and his daughter lives close by. He does require assistance with his IADLs mainly making his meals but he is independent of his ADLs. He does report about 45 pounds of weight loss over the last 3 months. Appetite is fair.patient has smoked 2-4 packs of cigarettes per day for the last 53 years and currently smokes one pack per day. Patient reports neuropathy in his hands and feet from unknown etiology. It doesn't interfere with his quality of life and ability to function.  ECOG PS- 2  Pain scale- 5   Review of systems- Review of Systems  Constitutional: Positive for malaise/fatigue and weight loss. Negative for chills and fever.  HENT: Negative for congestion, ear discharge and nosebleeds.   Eyes: Negative for blurred vision.  Respiratory: Positive for shortness of breath. Negative for cough, hemoptysis, sputum production and wheezing.   Cardiovascular: Negative for chest pain, palpitations, orthopnea and claudication.  Gastrointestinal: Negative for abdominal pain, blood in stool, constipation, diarrhea, heartburn, melena, nausea and vomiting.  Genitourinary: Negative for dysuria, flank pain, frequency, hematuria and urgency.  Musculoskeletal: Positive for back pain. Negative for joint pain and myalgias.  Skin: Negative for rash.  Neurological: Positive for tingling and weakness. Negative for dizziness, focal weakness, seizures and headaches.  Endo/Heme/Allergies: Does not bruise/bleed easily.  Psychiatric/Behavioral: Negative for depression and suicidal ideas. The patient does not have insomnia.     Allergies  Allergen Reactions  . Penicillins     Childhood reaction unknown reaction  .Has patient had a PCN reaction causing immediate rash, facial/tongue/throat swelling, SOB or lightheadedness with hypotension: Unknown Has patient had a PCN reaction causing severe rash involving mucus membranes or skin necrosis: Unknown Has patient had a PCN reaction that required  hospitalization: Unknown Has patient  had a PCN reaction occurring within the last 10 years: Unknown If all of the above answers are "NO", then may proceed with Cephalosporin use.     Patient Active Problem List   Diagnosis Date Noted  . Lung mass   . Acute respiratory failure with hypoxia ([place])   . Palliative care encounter   . Goals of care, counseling/discussion   . COPD exacerbation ([place]) 10/02/2016     Past Medical History:  Diagnosis Date  . Anxiety   . Arthritis   . Carpal tunnel syndrome, bilateral   . COPD (chronic obstructive pulmonary disease) ([place])   . Depression   . Diverticulosis   . Dyspnea   . GERD (gastroesophageal reflux disease)   . Headache   . IBS (irritable bowel syndrome)   . Occasional tremors   . On home oxygen therapy    2 L/M vai nasal cannula  . Peripheral neuropathy   . Seizures ([place]) 8841   alcoholic seizure x 1  . Sleep apnea    NO C-PAP  . Vertigo    occ  . Vitamin B 12 deficiency      Past Surgical History:  Procedure Laterality Date  . BACK SURGERY     Lumbat Laminectomy  . BACK SURGERY     Spinal Fusion  . ENDOBRONCHIAL ULTRASOUND N/A 12/01/2016   Procedure: ENDOBRONCHIAL ULTRASOUND;  Surgeon: [name], MD;  Location: [facility] ORS;  Service: Cardiopulmonary;  Laterality: N/A;  . TONSILLECTOMY      Social History   Social History  . Marital status: Single    Spouse name: N/A  . Number of children: N/A  . Years of education: N/A   Occupational History  . Not on file.   Social History Main Topics  . Smoking status: Current Every Day Smoker    Packs/day: 1.00    Years: 50.00    Types: Cigarettes  . Smokeless tobacco: Never Used  . Alcohol use Yes     Comment: Socially   . Drug use: No  . Sexual activity: Not on file   Other Topics Concern  . Not on file   Social History Narrative  . No narrative on file     Family History  Problem Relation Age of Onset  . Dementia Mother   . Parkinson's disease Father        Current Outpatient Prescriptions:  .  ADVAIR DISKUS 250-50 MCG/DOSE AEPB, Inhale 1 puff into the lungs every 12 (twelve) hours., Disp: , Rfl:  .  aspirin EC 81 MG tablet, Take 81 mg by mouth daily., Disp: , Rfl:  .  bisacodyl ([employer] GENTLE LAXATIVE) 5 MG EC tablet, Take 5-10 mg by mouth daily as needed for moderate constipation (FOR CONSTIPATION)., Disp: , Rfl:  .  doxazosin (CARDURA) 4 MG tablet, Take 4 mg by mouth daily., Disp: , Rfl:  .  finasteride (PROSCAR) 5 MG tablet, Take 5 mg by mouth daily., Disp: , Rfl:  .  gabapentin (NEURONTIN) 300 MG capsule, Take 300 mg by mouth 3 (three) times daily., Disp: , Rfl:  .  ipratropium-albuterol (DUONEB) 0.5-2.5 (3) MG/3ML SOLN, Take 3 mLs by nebulization every 6 (six) hours as needed., Disp: , Rfl:  .  LORazepam (ATIVAN) 0.5 MG tablet, Take 0.5 mg by mouth 2 (two) times daily., Disp: , Rfl:  .  oxyCODONE-acetaminophen (PERCOCET/ROXICET) 5-325 MG tablet, Take 1 tablet by mouth every 6 (six) hours as needed for severe pain., Disp: 20 tablet, Rfl: 0 .  ranitidine (  ZANTAC) 150 MG tablet, Take 150 mg by mouth 2 (two) times daily as needed (for gerd/ulcers.). , Disp: , Rfl:  .  sertraline (ZOLOFT) 100 MG tablet, Take 100 mg by mouth daily., Disp: , Rfl:  .  tiZANidine (ZANAFLEX) 4 MG tablet, Take 4 mg by mouth 3 (three) times daily., Disp: , Rfl:  .  VENTOLIN HFA 108 (90 Base) MCG/ACT inhaler, Inhale 2 puffs into the lungs every 6 (six) hours as needed for wheezing or shortness of breath. , Disp: , Rfl:  .  nicotine (NICODERM CQ - DOSED IN MG/24 HOURS) 14 mg/24hr patch, Place 1 patch (14 mg total) onto the skin daily. (Patient not taking: Reported on 12/07/2016), Disp: 30 patch, Rfl: 0   Physical exam:  Vitals:   12/07/16 1442  BP: 101/64  Pulse: 94  Resp: 18  Temp: 98.9 F (37.2 C)  TempSrc: Tympanic  Weight: 154 lb (69.9 kg)  Height: 5' 3.5" (1.613 m)  PF: (!) 2 L/min   Physical Exam  Constitutional: He is oriented to person, place, and  time.  Thin man sitting in a wheelchair. Mild persistent tremor in his hands. Pursed lip breathing  HENT:  Head: Normocephalic and atraumatic.  Eyes: Pupils are equal, round, and reactive to light. EOM are normal.  Neck: Normal range of motion.  Cardiovascular: Normal rate, regular rhythm and normal heart sounds.   Pulmonary/Chest:  Effort is increased. Breath sounds diminished bilaterally diffusely  Abdominal: Soft. Bowel sounds are normal.  Neurological: He is alert and oriented to person, place, and time.  Skin: Skin is warm and dry.       CMP Latest Ref Rng & Units 10/03/2016  Glucose 65 - 99 mg/dL 131(H)  BUN 6 - 20 mg/dL 14  Creatinine 0.61 - 1.24 mg/dL 0.79  Sodium 135 - 145 mmol/L 135  Potassium 3.5 - 5.1 mmol/L 4.2  Chloride 101 - 111 mmol/L 87(L)  CO2 22 - 32 mmol/L 36(H)  Calcium 8.9 - 10.3 mg/dL 8.9   CBC Latest Ref Rng & Units 10/03/2016  WBC 3.8 - 10.6 K/uL 3.4(L)  Hemoglobin 13.0 - 18.0 g/dL 16.3  Hematocrit 40.0 - 52.0 % 48.3  Platelets 150 - 440 K/uL 278     Assessment and plan- Patient is a 66 y.o. 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  I have personally reviewed the results of his CT scan and images independently and discussed the findings with the patient. I also discussed the pathology findings with him. Patient at least has stage III disease given his tumor size and contralateral adenopathy. At this time I will proceed with a PET CT scan to complete his staging workup as well as MRI of his brain with and without contrast.  I will see him back in 1 weeks' time after his PET CT scan to discuss further management. If he does not have any evidence of metastatic disease, he has stage III disease which would be treated with Concurrent chemoradiation. If he has evidence of metastatic disease-I will await results of Foundation one testing and PDL 1 testing ( to be  sent out today) to decide between palliative chemotherapy versus immunotherapy versus targeted therapy. Patient understands and agrees to proceed as planned. I will also plan to proceed with port placement at this time. I will also discuss his case at the tumor board next   Thank you for this  kind referral and the opportunity to participate in the care of this patient   Visit Diagnosis 1. Lung mass   2. Malignant neoplasm of unspecified part of unspecified bronchus or lung ([place])     Dr. Randa Evens, MD, MPH [facility] Pager- [phone] 12/07/2016 8:15 AM

## 2016-12-07 NOTE — Progress Notes (Signed)
Patient states that he is constantly in pain in his back, neck, hips and hands. He has irritable bowel with constipation and his pain medicine makes it worse. He has not had the Flu Vaccine yet, but would like to get it today.

## 2016-12-08 ENCOUNTER — Telehealth: Payer: Self-pay | Admitting: *Deleted

## 2016-12-08 NOTE — Telephone Encounter (Signed)
Spoke with pt's daughter to give new appt info for port placement. Informed pt is scheduled for port placement on Thu 10/25 at 2:30pm and needs to arrive at 1:30pm at the medical mall to register. Instructed pt needs to remain NPO at least 6 hours prior to procedure and instructed pt doesn't need to eat or drink anything after 7am the morning prior to procedure. Informed that another nurse would be in contact with her prior to procedure as well in review instructions. Also, informed that f/u appt on Thursday with Dr. Janese Yonael was moved to [facility] 10/26 at 3:15pm to accomodate port placement. Informed pt's daughter that our Lucianne Lei is available for transportation on the day of the port placement to bring him to appt if she would make arrangements to take patient home. Pt's daughter stated that would be able to bring pt to appt and will not need our [place] service at this time. No further questions or needs at this time. Pt's daughter verbalized understanding of appts given.

## 2016-12-09 ENCOUNTER — Encounter: Payer: Self-pay | Admitting: Oncology

## 2016-12-09 DIAGNOSIS — C3490 Malignant neoplasm of unspecified part of unspecified bronchus or lung: Secondary | ICD-10-CM | POA: Insufficient documentation

## 2016-12-14 ENCOUNTER — Ambulatory Visit
Admission: RE | Admit: 2016-12-14 | Discharge: 2016-12-14 | Disposition: A | Discharge status: Home / Self Care | Payer: Medicare Other | Source: Ambulatory Visit | Attending: Oncology | Admitting: Oncology

## 2016-12-14 ENCOUNTER — Other Ambulatory Visit: Payer: Self-pay | Admitting: General Surgery

## 2016-12-14 ENCOUNTER — Encounter: Payer: Self-pay | Admitting: *Deleted

## 2016-12-14 ENCOUNTER — Other Ambulatory Visit: Payer: Self-pay | Admitting: Radiology

## 2016-12-14 DIAGNOSIS — R918 Other nonspecific abnormal finding of lung field: Secondary | ICD-10-CM | POA: Insufficient documentation

## 2016-12-14 DIAGNOSIS — K118 Other diseases of salivary glands: Secondary | ICD-10-CM

## 2016-12-14 DIAGNOSIS — I700 Atherosclerosis of aorta: Secondary | ICD-10-CM

## 2016-12-14 DIAGNOSIS — C3490 Malignant neoplasm of unspecified part of unspecified bronchus or lung: Secondary | ICD-10-CM

## 2016-12-14 DIAGNOSIS — N289 Disorder of kidney and ureter, unspecified: Secondary | ICD-10-CM

## 2016-12-14 DIAGNOSIS — R591 Generalized enlarged lymph nodes: Secondary | ICD-10-CM

## 2016-12-14 LAB — GLUCOSE, CAPILLARY: Glucose-Capillary: 98 mg/dL (ref 65–99)

## 2016-12-14 LAB — POCT I-STAT CREATININE: CREATININE: 0.6 mg/dL — AB (ref 0.61–1.24)

## 2016-12-14 MED ORDER — GADOBENATE DIMEGLUMINE 529 MG/ML IV SOLN
15.0000 mL | Freq: Once | INTRAVENOUS | Status: AC | PRN
  Administered 2016-12-14: 15 mL via INTRAVENOUS

## 2016-12-14 MED ORDER — FLUDEOXYGLUCOSE F - 18 (FDG) INJECTION
12.0000 | Freq: Once | INTRAVENOUS | Status: AC | PRN
  Administered 2016-12-14: 12.77 via INTRAVENOUS

## 2016-12-14 NOTE — Progress Notes (Signed)
  Oncology Nurse Navigator Documentation  Navigator Location: [facility] Onc (12/14/16 1100)   )Navigator Encounter Type: Telephone;Diagnostic Results (12/14/16 1100)                         Barriers/Navigation Needs: Coordination of Care (12/14/16 1100)   Interventions: Coordination of Care (12/14/16 1100)   Coordination of Care: Appts (12/14/16 1100)         spoke with pt's daughter concerning results from PET scan and Brain MRI this morning. Per Dr. Janese Tran, pt will need to see med-onc and rad-onc tomorrow morning prior to [facility] placement. Pt scheduled to see Dr. Janese Yohannes at 9:30am and Dr. Baruch Gouty at [place]. Pt's daughter has been notified of these appts. Results from PET and MRI reviewed with pt's daughter and informed her that Dr. Janese Alika will discuss findings further at appt tomorrow and will be discussing treatment options as well. Pt's daughter has no further questions at this time. Instructed to call if has anymore questions or concerns. Pt's daughter verbalized understanding and confirmed appts for tomorrow.          Time Spent with Patient: 30 (12/14/16 1100)

## 2016-12-15 ENCOUNTER — Inpatient Hospital Stay (HOSPITAL_BASED_OUTPATIENT_CLINIC_OR_DEPARTMENT_OTHER): Payer: Medicare Other | Admitting: Oncology

## 2016-12-15 ENCOUNTER — Encounter: Payer: Self-pay | Admitting: *Deleted

## 2016-12-15 ENCOUNTER — Ambulatory Visit: Payer: Medicare Other | Admitting: Oncology

## 2016-12-15 ENCOUNTER — Other Ambulatory Visit: Payer: Self-pay | Admitting: *Deleted

## 2016-12-15 ENCOUNTER — Ambulatory Visit
Admission: RE | Admit: 2016-12-15 | Discharge: 2016-12-15 | Disposition: A | Discharge status: Home / Self Care | Payer: Medicare Other | Source: Ambulatory Visit | Attending: Radiation Oncology | Admitting: Radiation Oncology

## 2016-12-15 ENCOUNTER — Encounter: Payer: Self-pay | Admitting: Oncology

## 2016-12-15 ENCOUNTER — Ambulatory Visit
Admission: RE | Admit: 2016-12-15 | Discharge: 2016-12-15 | Disposition: A | Discharge status: Home / Self Care | Payer: Medicare Other | Source: Ambulatory Visit | Attending: Oncology | Admitting: Oncology

## 2016-12-15 VITALS — BP 114/52 | HR 77 | Temp 98.8°F | Resp 16

## 2016-12-15 DIAGNOSIS — Z880 Allergy status to penicillin: Secondary | ICD-10-CM | POA: Insufficient documentation

## 2016-12-15 DIAGNOSIS — G4730 Sleep apnea, unspecified: Secondary | ICD-10-CM

## 2016-12-15 DIAGNOSIS — M549 Dorsalgia, unspecified: Secondary | ICD-10-CM | POA: Diagnosis not present

## 2016-12-15 DIAGNOSIS — E538 Deficiency of other specified B group vitamins: Secondary | ICD-10-CM | POA: Insufficient documentation

## 2016-12-15 DIAGNOSIS — C3402 Malignant neoplasm of left main bronchus: Secondary | ICD-10-CM | POA: Insufficient documentation

## 2016-12-15 DIAGNOSIS — Z7982 Long term (current) use of aspirin: Secondary | ICD-10-CM | POA: Insufficient documentation

## 2016-12-15 DIAGNOSIS — G629 Polyneuropathy, unspecified: Secondary | ICD-10-CM | POA: Diagnosis not present

## 2016-12-15 DIAGNOSIS — M129 Arthropathy, unspecified: Secondary | ICD-10-CM

## 2016-12-15 DIAGNOSIS — K8020 Calculus of gallbladder without cholecystitis without obstruction: Secondary | ICD-10-CM

## 2016-12-15 DIAGNOSIS — Z79899 Other long term (current) drug therapy: Secondary | ICD-10-CM

## 2016-12-15 DIAGNOSIS — R918 Other nonspecific abnormal finding of lung field: Secondary | ICD-10-CM

## 2016-12-15 DIAGNOSIS — G40909 Epilepsy, unspecified, not intractable, without status epilepticus: Secondary | ICD-10-CM | POA: Insufficient documentation

## 2016-12-15 DIAGNOSIS — R5383 Other fatigue: Secondary | ICD-10-CM

## 2016-12-15 DIAGNOSIS — R0602 Shortness of breath: Secondary | ICD-10-CM

## 2016-12-15 DIAGNOSIS — Z8719 Personal history of other diseases of the digestive system: Secondary | ICD-10-CM

## 2016-12-15 DIAGNOSIS — K5903 Drug induced constipation: Secondary | ICD-10-CM | POA: Diagnosis not present

## 2016-12-15 DIAGNOSIS — G8929 Other chronic pain: Secondary | ICD-10-CM

## 2016-12-15 DIAGNOSIS — C3490 Malignant neoplasm of unspecified part of unspecified bronchus or lung: Secondary | ICD-10-CM

## 2016-12-15 DIAGNOSIS — T402X5S Adverse effect of other opioids, sequela: Secondary | ICD-10-CM | POA: Diagnosis not present

## 2016-12-15 DIAGNOSIS — K589 Irritable bowel syndrome without diarrhea: Secondary | ICD-10-CM

## 2016-12-15 DIAGNOSIS — C7931 Secondary malignant neoplasm of brain: Secondary | ICD-10-CM

## 2016-12-15 DIAGNOSIS — C3432 Malignant neoplasm of lower lobe, left bronchus or lung: Secondary | ICD-10-CM

## 2016-12-15 DIAGNOSIS — F419 Anxiety disorder, unspecified: Secondary | ICD-10-CM

## 2016-12-15 DIAGNOSIS — F17210 Nicotine dependence, cigarettes, uncomplicated: Secondary | ICD-10-CM | POA: Diagnosis not present

## 2016-12-15 DIAGNOSIS — K219 Gastro-esophageal reflux disease without esophagitis: Secondary | ICD-10-CM | POA: Insufficient documentation

## 2016-12-15 DIAGNOSIS — G893 Neoplasm related pain (acute) (chronic): Secondary | ICD-10-CM | POA: Insufficient documentation

## 2016-12-15 DIAGNOSIS — J449 Chronic obstructive pulmonary disease, unspecified: Secondary | ICD-10-CM | POA: Insufficient documentation

## 2016-12-15 DIAGNOSIS — I700 Atherosclerosis of aorta: Secondary | ICD-10-CM

## 2016-12-15 DIAGNOSIS — Z7951 Long term (current) use of inhaled steroids: Secondary | ICD-10-CM | POA: Insufficient documentation

## 2016-12-15 DIAGNOSIS — Z981 Arthrodesis status: Secondary | ICD-10-CM | POA: Insufficient documentation

## 2016-12-15 DIAGNOSIS — R221 Localized swelling, mass and lump, neck: Secondary | ICD-10-CM

## 2016-12-15 DIAGNOSIS — M1990 Unspecified osteoarthritis, unspecified site: Secondary | ICD-10-CM | POA: Insufficient documentation

## 2016-12-15 DIAGNOSIS — Z9981 Dependence on supplemental oxygen: Secondary | ICD-10-CM | POA: Insufficient documentation

## 2016-12-15 DIAGNOSIS — R531 Weakness: Secondary | ICD-10-CM

## 2016-12-15 DIAGNOSIS — F329 Major depressive disorder, single episode, unspecified: Secondary | ICD-10-CM

## 2016-12-15 DIAGNOSIS — R634 Abnormal weight loss: Secondary | ICD-10-CM

## 2016-12-15 DIAGNOSIS — Z809 Family history of malignant neoplasm, unspecified: Secondary | ICD-10-CM | POA: Insufficient documentation

## 2016-12-15 DIAGNOSIS — T402X5A Adverse effect of other opioids, initial encounter: Secondary | ICD-10-CM

## 2016-12-15 DIAGNOSIS — R251 Tremor, unspecified: Secondary | ICD-10-CM | POA: Insufficient documentation

## 2016-12-15 DIAGNOSIS — Z7189 Other specified counseling: Secondary | ICD-10-CM

## 2016-12-15 HISTORY — PX: IR FLUORO GUIDE PORT INSERTION RIGHT: IMG5741

## 2016-12-15 LAB — CBC
HCT: 37.4 % — ABNORMAL LOW (ref 40.0–52.0)
HEMOGLOBIN: 12.2 g/dL — AB (ref 13.0–18.0)
MCH: 28.2 pg (ref 26.0–34.0)
MCHC: 32.7 g/dL (ref 32.0–36.0)
MCV: 86.3 fL (ref 80.0–100.0)
PLATELETS: 420 10*3/uL (ref 150–440)
RBC: 4.33 MIL/uL — AB (ref 4.40–5.90)
RDW: 14.6 % — ABNORMAL HIGH (ref 11.5–14.5)
WBC: 11.1 10*3/uL — ABNORMAL HIGH (ref 3.8–10.6)

## 2016-12-15 LAB — BASIC METABOLIC PANEL
ANION GAP: 9 (ref 5–15)
BUN: 8 mg/dL (ref 6–20)
CALCIUM: 8.9 mg/dL (ref 8.9–10.3)
CO2: 31 mmol/L (ref 22–32)
CREATININE: 0.61 mg/dL (ref 0.61–1.24)
Chloride: 92 mmol/L — ABNORMAL LOW (ref 101–111)
Glucose, Bld: 97 mg/dL (ref 65–99)
Potassium: 3.6 mmol/L (ref 3.5–5.1)
SODIUM: 132 mmol/L — AB (ref 135–145)

## 2016-12-15 LAB — PROTIME-INR
INR: 1.08
PROTHROMBIN TIME: 13.9 s (ref 11.4–15.2)

## 2016-12-15 LAB — APTT: APTT: 35 s (ref 24–36)

## 2016-12-15 MED ORDER — VANCOMYCIN HCL IN DEXTROSE 1-5 GM/200ML-% IV SOLN
1000.0000 mg | INTRAVENOUS | Status: AC
  Administered 2016-12-15: 1000 mg via INTRAVENOUS
  Filled 2016-12-15: qty 200

## 2016-12-15 MED ORDER — FENTANYL CITRATE (PF) 100 MCG/2ML IJ SOLN
INTRAMUSCULAR | Status: AC | PRN
  Administered 2016-12-15 (×2): 50 ug via INTRAVENOUS

## 2016-12-15 MED ORDER — MIDAZOLAM HCL 2 MG/2ML IJ SOLN
INTRAMUSCULAR | Status: AC
  Filled 2016-12-15: qty 2

## 2016-12-15 MED ORDER — HEPARIN SOD (PORK) LOCK FLUSH 100 UNIT/ML IV SOLN
INTRAVENOUS | Status: AC
  Filled 2016-12-15: qty 5

## 2016-12-15 MED ORDER — OXYCODONE HCL 5 MG PO TABS: 5.0000 mg | ORAL_TABLET | Freq: Four times a day (QID) | ORAL | 0 refills | Status: DC | PRN

## 2016-12-15 MED ORDER — LIDOCAINE HCL (PF) 1 % IJ SOLN
INTRAMUSCULAR | Status: AC
  Filled 2016-12-15: qty 30

## 2016-12-15 MED ORDER — MIDAZOLAM HCL 2 MG/2ML IJ SOLN
INTRAMUSCULAR | Status: AC | PRN
  Administered 2016-12-15 (×2): 1 mg via INTRAVENOUS

## 2016-12-15 MED ORDER — FENTANYL CITRATE (PF) 100 MCG/2ML IJ SOLN
INTRAMUSCULAR | Status: AC
  Filled 2016-12-15: qty 2

## 2016-12-15 MED ORDER — DEXAMETHASONE 4 MG PO TABS: 4.0000 mg | ORAL_TABLET | Freq: Every day | ORAL | 0 refills | Status: DC

## 2016-12-15 MED ORDER — SODIUM CHLORIDE FLUSH 0.9 % IV SOLN
INTRAVENOUS | Status: AC
  Filled 2016-12-15: qty 20

## 2016-12-15 MED ORDER — SODIUM CHLORIDE 0.9 % IV SOLN
INTRAVENOUS | Status: DC
  Administered 2016-12-15: 1000 mL via INTRAVENOUS

## 2016-12-15 MED ORDER — HEPARIN (PORCINE) IN NACL 2-0.9 UNIT/ML-% IJ SOLN
INTRAMUSCULAR | Status: AC
  Filled 2016-12-15: qty 500

## 2016-12-15 NOTE — Progress Notes (Signed)
Patient here for follow up with results. He states that he is very tired today from having the MRI and PET done yesterday. He complains of continued pain in his back, legs, hips and ribs.

## 2016-12-15 NOTE — H&P (Signed)
Chief Complaint: Patient was seen in consultation today for porta-cath placement at the request of Rao,Archana C  Referring Physician(s): Rao,Archana C  Patient Status: [facility] - Out-pt  History of Present Illness: Bradley Frederick is a 66 y.o. male with metastatic non-small cell lung carcinoma with a left perihilar mass extending into the left lower lobe, lymph node mets, subcutaneous mets and multiple brain metastases.  Presents today for port placement prior to planned radiation therapy followed by chemotherapy.  Has some cough, shortness of breath, chest wall pain.  Able to ambulate by himself.  Past Medical History:  Diagnosis Date  . Anxiety   . Arthritis   . Carpal tunnel syndrome, bilateral   . COPD (chronic obstructive pulmonary disease) ([place])   . Depression   . Diverticulosis   . Dyspnea   . GERD (gastroesophageal reflux disease)   . Headache   . IBS (irritable bowel syndrome)   . Occasional tremors   . On home oxygen therapy    2 L/M vai nasal cannula  . Peripheral neuropathy   . Seizures ([place]) 8185   alcoholic seizure x 1  . Sleep apnea    NO C-PAP  . Vertigo    occ  . Vitamin B 12 deficiency     Past Surgical History:  Procedure Laterality Date  . BACK SURGERY     Lumbat Laminectomy  . BACK SURGERY     Spinal Fusion  . ENDOBRONCHIAL ULTRASOUND N/A 12/01/2016   Procedure: ENDOBRONCHIAL ULTRASOUND;  Surgeon: [name], MD;  Location: [facility] ORS;  Service: Cardiopulmonary;  Laterality: N/A;  . TONSILLECTOMY      Allergies: Penicillins  Medications: Prior to Admission medications   Medication Sig Start Date End Date Taking? Authorizing Provider  ADVAIR DISKUS 250-50 MCG/DOSE AEPB Inhale 1 puff into the lungs every 12 (twelve) hours. 09/27/16  Yes [provider]  aspirin EC 81 MG tablet Take 81 mg by mouth daily.   Yes [provider]  bisacodyl ([employer] GENTLE LAXATIVE) 5 MG EC tablet Take 5-10 mg by mouth daily as needed for moderate  constipation (FOR CONSTIPATION).   Yes [provider]  doxazosin (CARDURA) 4 MG tablet Take 4 mg by mouth daily. 08/31/16  Yes [provider]  finasteride (PROSCAR) 5 MG tablet Take 5 mg by mouth daily. 08/31/16  Yes [provider]  gabapentin (NEURONTIN) 300 MG capsule Take 300 mg by mouth 3 (three) times daily. 10/21/16  Yes [provider]  ipratropium-albuterol (DUONEB) 0.5-2.5 (3) MG/3ML SOLN Take 3 mLs by nebulization every 6 (six) hours as needed.   Yes [provider]  LORazepam (ATIVAN) 0.5 MG tablet Take 0.5 mg by mouth 2 (two) times daily. 10/21/16  Yes [provider]  oxyCODONE (OXY IR/ROXICODONE) 5 MG immediate release tablet Take 1 tablet (5 mg total) by mouth every 6 (six) hours as needed for severe pain. 12/15/16  Yes Sindy Guadeloupe, MD  ranitidine (ZANTAC) 150 MG tablet Take 150 mg by mouth 2 (two) times daily as needed (for gerd/ulcers.).  10/21/16  Yes [provider]  sertraline (ZOLOFT) 100 MG tablet Take 100 mg by mouth daily. 10/21/16  Yes [provider]  tiZANidine (ZANAFLEX) 4 MG tablet Take 4 mg by mouth 3 (three) times daily. 10/21/16  Yes [provider]  VENTOLIN HFA 108 (90 Base) MCG/ACT inhaler Inhale 2 puffs into the lungs every 6 (six) hours as needed for wheezing or shortness of breath.  07/08/16  Yes [provider]  dexamethasone (DECADRON) 4 MG tablet Take 1 tablet (4 mg total) by mouth daily. 12/15/16   Noreene Filbert, MD  nicotine (NICODERM CQ - DOSED IN MG/24 HOURS) 14 mg/24hr patch Place 1 patch (14 mg total) onto the skin daily. Patient not taking: Reported on 12/07/2016 10/06/16   Hillary Bow, MD     Family History  Problem Relation Age of Onset  . Dementia Mother   . Parkinson's disease Father   . Cancer Paternal Aunt   . Cancer Paternal Grandmother     Social History   Social History  . Marital status: Single    Spouse name: N/A  . Number of children: N/A   . Years of education: N/A   Social History Main Topics  . Smoking status: Current Every Day Smoker    Packs/day: 1.00    Years: 50.00    Types: Cigarettes  . Smokeless tobacco: Never Used  . Alcohol use Yes     Comment: Socially   . Drug use: No  . Sexual activity: Not Asked   Other Topics Concern  . None   Social History Narrative  . None    ECOG Status: 1 - Symptomatic but completely ambulatory  Review of Systems: A 12 point ROS discussed and pertinent positives are indicated in the HPI above.  All other systems are negative.  Review of Systems  Constitutional: Positive for fatigue.  HENT: Negative.   Respiratory: Positive for cough and shortness of breath.   Cardiovascular: Negative.   Gastrointestinal: Negative.   Genitourinary: Negative.   Musculoskeletal: Negative.   Neurological: Negative.     Vital Signs: BP 122/65   Pulse 75   Temp 99.1 F (37.3 C) (Oral)   Resp 19   Ht 5' 3.5" (1.613 m)   Wt 154 lb (69.9 kg)   SpO2 97%   BMI 26.85 kg/m   Physical Exam  Constitutional: He is oriented to person, place, and time. No distress.  HENT:  Head: Normocephalic and atraumatic.  Neck: Neck supple. No JVD present. No tracheal deviation present. No thyromegaly present.  Cardiovascular: Normal rate and regular rhythm.  Exam reveals no gallop and no friction rub.   No murmur heard. Pulmonary/Chest: He has wheezes.  Distant breath sounds.  Sparse wheezing bilaterally.  Abdominal: Soft. Bowel sounds are normal. He exhibits no distension and no mass. There is no tenderness. There is no rebound and no guarding.  Musculoskeletal: He exhibits no edema.  Neurological: He is alert and oriented to person, place, and time.  Skin: Skin is warm and dry. He is not diaphoretic.  Vitals reviewed.   Imaging: Mr Jeri Cos WF Contrast  Result Date: 12/14/2016 CLINICAL DATA:  Non-small cell lung cancer.  Staging. EXAM: MRI HEAD WITHOUT AND WITH CONTRAST TECHNIQUE:  Multiplanar, multiecho pulse sequences of the brain and surrounding structures were obtained without and with intravenous contrast. CONTRAST:  32mL MULTIHANCE GADOBENATE DIMEGLUMINE 529 MG/ML IV SOLN COMPARISON:  12/14/2016 PET-CT. FINDINGS: Brain: There is no evidence of acute infarct, midline shift, or extra-axial fluid collection. Mild generalized cerebral atrophy is within normal limits for age. Scattered small foci of T2 hyperintensity in the cerebral white matter and pons are nonspecific but compatible with minimal chronic small vessel ischemic disease. There are multiple enhancing brain lesions, many of which are hemorrhagic. Two lesions are present in the right cerebellum, the larger measuring 10 mm (series 14, image 46). There are 8 supratentorial lesions, with the largest measuring 15 mm in  the left middle frontal gyrus (series 14, image 132). There is mild edema associated with the larger cerebellar lesion and with a few of the cerebral lesions, without significant mass effect. Vascular: Major intracranial vascular flow voids are preserved. Skull and upper cervical spine: No suspicious marrow lesion. Moderate cervical disc degeneration. Sinuses/Orbits: Unremarkable orbits. Paranasal sinuses and mastoid air cells are clear. Other: 1.4 cm mass within or immediately posterior to the left parotid gland, hypermetabolic on today's PET-CT. Smaller 6 mm nodule more inferiorly along the posterior left parotid gland. IMPRESSION: 1. Ten hemorrhagic brain lesions consistent with metastases. At most mild edema without mass effect. 2. 1.4 cm left parotid/periparotid mass which may reflect primary parotid neoplasm or possibly metastatic disease. Electronically Signed   By: Logan Bores M.D.   On: 12/14/2016 10:24   Nm Pet Image Initial (pi) Skull Base To Thigh  Result Date: 12/14/2016 CLINICAL DATA:  Initial treatment strategy for non-small-cell lung cancer. EXAM: NUCLEAR MEDICINE PET SKULL BASE TO THIGH TECHNIQUE:  12.8 mCi F-18 FDG was injected intravenously. Full-ring PET imaging was performed from the skull base to thigh after the radiotracer. CT data was obtained and used for attenuation correction and anatomic localization. FASTING BLOOD GLUCOSE:  Value: 98 mg/dl COMPARISON:  CT scan 10/02/2016 FINDINGS: NECK: Hypermetabolic nodule identified along or in the posterior aspect the left parotid gland measures 7 mm short axis with SUV max = 5.7 CHEST: Left hilar lymphadenopathy is markedly hypermetabolic with SUV max = 10.6. The confluent soft tissue attenuation in the left lower lobe is diffusely hypermetabolic with SUV max = 13. There is hypermetabolic some parietal lymphadenopathy. Hypermetabolic AP window lymphadenopathy demonstrates SUV max = 14.4. Several hypermetabolic subcutaneous nodules are identified in the chest. 15 mm left lateral chest wall nodule in the mid axillary line (image 117 series 4) is hypermetabolic with SUV max = 9.5. Hypermetabolic sub xiphoid nodule in the anterior midline (image 139) demonstrates SUV max = 12.3. Tiny subcutaneous nodules in the posterior left upper back (image 35 series 4) are hypermetabolic. ABDOMEN/PELVIS: 10 mm exophytic lesion upper pole left kidney is hypermetabolic with SUV max = 5.2. Tiny calcified gallstones are evident. There is abdominal aortic atherosclerosis without aneurysm. SKELETON: No focal hypermetabolic activity to suggest skeletal metastasis. IMPRESSION: 1. Marked hypermetabolism in the left lower lobe with hypermetabolic lymphadenopathy in the left hilum and mediastinum. No hypermetabolism in the right hilum on today's study. 2. Several hypermetabolic subcutaneous nodules in the chest suggest metastatic involvement. 3. Hypermetabolic soft tissue nodule adjacent to or in the posterior aspect of the left parotid gland may represent metastatic disease or primary parotid neoplasm. 4. 10 mm exophytic lesion upper pole left kidney demonstrates hypermetabolism. Renal  cell carcinoma could have this appearance. Abdominal MRI without and with contrast would be the study of choice to further evaluate. 5.  Aortic Atherosclerois (ICD10-170.0) Electronically Signed   By: Misty Stanley M.D.   On: 12/14/2016 09:54    Labs:  CBC:  Recent Labs  10/02/16 1351 10/03/16 0528 12/15/16 1310  WBC 7.7 3.4* 11.1*  HGB 16.3 16.3 12.2*  HCT 47.8 48.3 37.4*  PLT 299 278 420    COAGS: No results for input(s): INR, APTT in the last 8760 hours.  BMP:  Recent Labs  10/02/16 1351 10/03/16 0528 12/14/16 0916 12/15/16 1310  NA 134* 135  --  132*  K 3.8 4.2  --  3.6  CL 90* 87*  --  92*  CO2 34* 36*  --  31  GLUCOSE 102* 131*  --  97  BUN 10 14  --  8  CALCIUM 9.4 8.9  --  8.9  CREATININE 0.61 0.79 0.60* 0.61  GFRNONAA >60 >60  --  >60  GFRAA >60 >60  --  >60    Assessment and Plan:  For right IJ/chest port placement today with moderate conscious sedation.  Risks and benefits discussed with the patient including, but not limited to bleeding, infection, pneumothorax, or fibrin sheath development and need for additional procedures. All of the patient's questions were answered, patient is agreeable to proceed. Consent signed and in chart.  Thank you for this interesting consult.  I greatly enjoyed meeting DEITRICH STEVE and look forward to participating in their care.  A copy of this report was sent to the requesting provider on this date.  Electronically Signed: Azzie Roup, MD 12/15/2016, 1:55 PM   I spent a total of 30 Minutes in face to face in clinical consultation, greater than 50% of which was counseling/coordinating care for port placement.

## 2016-12-15 NOTE — Progress Notes (Signed)
  Oncology Nurse Navigator Documentation  Navigator Location: [facility] Onc (12/15/16 1100)   )Navigator Encounter Type: Diagnostic Results;Follow-up Appt (12/15/16 1100)                     Patient Visit Type: MedOnc;RadOnc (12/15/16 1100)   Barriers/Navigation Needs: Coordination of Care (12/15/16 1100)   Interventions: Referrals;Coordination of Care (12/15/16 1100) Referrals: Palliative Care (12/15/16 1100) Coordination of Care: Appts;Hospice/Palliative Care (12/15/16 1100)         met with patient during follow up visit with Dr. Janese Tayo to review results from PET scan and brain MRI from yesterday. All questions answered at the time of visit. Coordinated appt for pt to see Dr. Baruch Gouty today to further discuss radiation treatments. Informed pt and daughter that will first complete radiation treatments to the brain then will proceed with systemic treatment after radiation per Dr. Janese Suleyman. appt scheduled with Dr. Janese Shiraz on 11/8 to discuss treatment planning. No further questions or needs at this time. Pt and daughter instructed to call with any questions or needs. Understanding verbalized.          Time Spent with Patient: 60 (12/15/16 1100)

## 2016-12-15 NOTE — Progress Notes (Signed)
Hematology/Oncology Consult note [facility]  Telephone:([phone] [phone]  Patient Care Team: Marygrace Drought, MD as PCP - General (Family Medicine) Telford Nab, RN as Registered Nurse   Name of the patient: Bradley Frederick  [id number]  02-12-1951   Date of visit: 12/15/16  Diagnosis- Stage IV NSCLC (unable to differentiate between squamous and adenocarcinoma) cT2N2M1b with brain and subcutaneous metastases  Chief complaint/ Reason for visit- discuss PET/CT and MRI brain results  Heme/Onc history: 1. Patient is a 66 year old male with a past medical history significant for COPD, depression and chronic back pain and neuropathy of unknown etiology. He recently was admitted to the hospital in August 2018 with worsening shortness of breath and cough.He was treated for COPD exacerbation and underwent CT chest at that time  2. CT chest showed 3.4 x 3.4 x 2.8 cm left hilar mass with pulmonary arterial encasement and invasion into the adjacent left lower bronchus causing moderate narrowing of the bronchus. Mildly enlarged right hilar lymph node suspicious for metastatic node.multiple small irregular densities with somewhat of a train but appearance in the left lower lobe. This could represent endobronchial or lymphatic spread of tumor, bronchiolitis or partial postobstructive changes. Small linear filling defect in the left upper lobe pulmonary arterial branch which may represent an area of fibrosis from the nearest PE. An acute PE is less likely.  3. Patient was discharged on 2 L of home oxygen and underwent bronchoscopy as an outpatient by Dr. Mortimer Fries. bronchoscopy showed a completely obstructing mass in the left lower lobe. Mass was large bloody circumferential endobronchial exophytic friable fungating an infiltrative.bronchoscopy brushings and endobronchial biopsy showed poorly differentiated carcinoma-non-small cell carcinoma. Differentiation between  adenocarcinoma and squamous cell carcinoma could not be done  4. He currently lives alone and his daughter lives close by. He does require assistance with his IADLs mainly making his meals but he is independent of his ADLs. He does report about 45 pounds of weight loss over the last 3 months. Appetite is fair.patient has smoked 2-4 packs of cigarettes per day for the last 53 years and currently smokes one pack per day. Patient reports neuropathy in his hands and feet from unknown etiology. It doesn't interfere with his quality of life and ability to function.   Interval history- reports pain over left chest wall. Has sob at baseline that is unchanged. He has been taking half a tablet of oxycodone every 6 hours but has not been helping with his pain. He is hesitant to take higher doses because of ongoing constipation  ECOG PS- 2 Pain scale- 4 Opioid associated constipation- yes  Review of systems- Review of Systems  Constitutional: Positive for malaise/fatigue and weight loss. Negative for chills and fever.  HENT: Negative for congestion, ear discharge and nosebleeds.   Eyes: Negative for blurred vision.  Respiratory: Positive for shortness of breath. Negative for cough, hemoptysis, sputum production and wheezing.   Cardiovascular: Negative for chest pain, palpitations, orthopnea and claudication.       Chest wall pain  Gastrointestinal: Negative for abdominal pain, blood in stool, constipation, diarrhea, heartburn, melena, nausea and vomiting.  Genitourinary: Negative for dysuria, flank pain, frequency, hematuria and urgency.  Musculoskeletal: Negative for back pain, joint pain and myalgias.  Skin: Negative for rash.  Neurological: Positive for weakness. Negative for dizziness, tingling, focal weakness, seizures and headaches.  Endo/Heme/Allergies: Does not bruise/bleed easily.  Psychiatric/Behavioral: Negative for depression and suicidal ideas. The patient does not have insomnia.  Allergies  Allergen Reactions  . Penicillins     Childhood reaction unknown reaction  .Has patient had a PCN reaction causing immediate rash, facial/tongue/throat swelling, SOB or lightheadedness with hypotension: Unknown Has patient had a PCN reaction causing severe rash involving mucus membranes or skin necrosis: Unknown Has patient had a PCN reaction that required hospitalization: Unknown Has patient had a PCN reaction occurring within the last 10 years: Unknown If all of the above answers are "NO", then may proceed with Cephalosporin use.      Past Medical History:  Diagnosis Date  . Anxiety   . Arthritis   . Carpal tunnel syndrome, bilateral   . COPD (chronic obstructive pulmonary disease) ([place])   . Depression   . Diverticulosis   . Dyspnea   . GERD (gastroesophageal reflux disease)   . Headache   . IBS (irritable bowel syndrome)   . Occasional tremors   . On home oxygen therapy    2 L/M vai nasal cannula  . Peripheral neuropathy   . 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 Sleep apnea    NO C-PAP  . Vertigo    occ  . Vitamin B 12 deficiency      Past Surgical History:  Procedure Laterality Date  . BACK SURGERY     Lumbat Laminectomy  . BACK SURGERY     Spinal Fusion  . ENDOBRONCHIAL ULTRASOUND N/A 12/01/2016   Procedure: ENDOBRONCHIAL ULTRASOUND;  Surgeon: [name], MD;  Location: [facility] ORS;  Service: Cardiopulmonary;  Laterality: N/A;  . TONSILLECTOMY      Social History   Social History  . Marital status: Single    Spouse name: N/A  . Number of children: N/A  . Years of education: N/A   Occupational History  . Not on file.   Social History Main Topics  . Smoking status: Current Every Day Smoker    Packs/day: 1.00    Years: 50.00    Types: Cigarettes  . Smokeless tobacco: Never Used  . Alcohol use Yes     Comment: Socially   . Drug use: No  . Sexual activity: Not on file   Other Topics Concern  . Not on file   Social  History Narrative  . No narrative on file    Family History  Problem Relation Age of Onset  . Dementia Mother   . Parkinson's disease Father   . Cancer Paternal Aunt   . Cancer Paternal Grandmother      Current Outpatient Prescriptions:  .  ADVAIR DISKUS 250-50 MCG/DOSE AEPB, Inhale 1 puff into the lungs every 12 (twelve) hours., Disp: , Rfl:  .  aspirin EC 81 MG tablet, Take 81 mg by mouth daily., Disp: , Rfl:  .  bisacodyl ([employer] GENTLE LAXATIVE) 5 MG EC tablet, Take 5-10 mg by mouth daily as needed for moderate constipation (FOR CONSTIPATION)., Disp: , Rfl:  .  doxazosin (CARDURA) 4 MG tablet, Take 4 mg by mouth daily., Disp: , Rfl:  .  finasteride (PROSCAR) 5 MG tablet, Take 5 mg by mouth daily., Disp: , Rfl:  .  gabapentin (NEURONTIN) 300 MG capsule, Take 300 mg by mouth 3 (three) times daily., Disp: , Rfl:  .  ipratropium-albuterol (DUONEB) 0.5-2.5 (3) MG/3ML SOLN, Take 3 mLs by nebulization every 6 (six) hours as needed., Disp: , Rfl:  .  LORazepam (ATIVAN) 0.5 MG tablet, Take 0.5 mg by mouth 2 (two) times daily., Disp: , Rfl:  .  ranitidine (  ZANTAC) 150 MG tablet, Take 150 mg by mouth 2 (two) times daily as needed (for gerd/ulcers.). , Disp: , Rfl:  .  sertraline (ZOLOFT) 100 MG tablet, Take 100 mg by mouth daily., Disp: , Rfl:  .  tiZANidine (ZANAFLEX) 4 MG tablet, Take 4 mg by mouth 3 (three) times daily., Disp: , Rfl:  .  VENTOLIN HFA 108 (90 Base) MCG/ACT inhaler, Inhale 2 puffs into the lungs every 6 (six) hours as needed for wheezing or shortness of breath. , Disp: , Rfl:  .  dexamethasone (DECADRON) 4 MG tablet, Take 1 tablet (4 mg total) by mouth daily., Disp: 30 tablet, Rfl: 0 .  nicotine (NICODERM CQ - DOSED IN MG/24 HOURS) 14 mg/24hr patch, Place 1 patch (14 mg total) onto the skin daily. (Patient not taking: Reported on 12/07/2016), Disp: 30 patch, Rfl: 0 .  oxyCODONE (OXY IR/ROXICODONE) 5 MG immediate release tablet, Take 1 tablet (5 mg total) by mouth every 6 (six)  hours as needed for severe pain., Disp: 60 tablet, Rfl: 0  Physical exam:  Vitals:   12/15/16 0933  BP: (!) 114/52  Pulse: 77  Resp: 16  Temp: 98.8 F (37.1 C)  TempSrc: Tympanic   Physical Exam  Constitutional: He is oriented to person, place, and time.  Patient is sitting in a wheelchair on home oxygen and appears to be in mild distress due to shortness of breath. He has pursed lip breathing  HENT:  Head: Normocephalic and atraumatic.  Eyes: Pupils are equal, round, and reactive to light. EOM are normal.  Neck: Normal range of motion.  Cardiovascular: Normal rate, regular rhythm and normal heart sounds.   Pulmonary/Chest: Effort normal.  Breath sounds decreased bilaterally diffusely  Abdominal: Soft. Bowel sounds are normal.  Musculoskeletal:  Palpable subcutaneous nodule seen in the epigastrium as well as the left upper quadrant below the rib cage  Neurological: He is alert and oriented to person, place, and time.  Skin: Skin is warm and dry.     CMP Latest Ref Rng & Units 12/14/2016  Glucose 65 - 99 mg/dL -  BUN 6 - 20 mg/dL -  Creatinine 0.61 - 1.24 mg/dL 0.60(L)  Sodium 135 - 145 mmol/L -  Potassium 3.5 - 5.1 mmol/L -  Chloride 101 - 111 mmol/L -  CO2 22 - 32 mmol/L -  Calcium 8.9 - 10.3 mg/dL -   CBC Latest Ref Rng & Units 10/03/2016  WBC 3.8 - 10.6 K/uL 3.4(L)  Hemoglobin 13.0 - 18.0 g/dL 16.3  Hematocrit 40.0 - 52.0 % 48.3  Platelets 150 - 440 K/uL 278    No images are attached to the encounter.  Mr Jeri Cos Wo Contrast  Result Date: 12/14/2016 CLINICAL DATA:  Non-small cell lung cancer.  Staging. EXAM: MRI HEAD WITHOUT AND WITH CONTRAST TECHNIQUE: Multiplanar, multiecho pulse sequences of the brain and surrounding structures were obtained without and with intravenous contrast. CONTRAST:  35mL MULTIHANCE GADOBENATE DIMEGLUMINE 529 MG/ML IV SOLN COMPARISON:  12/14/2016 PET-CT. FINDINGS: Brain: There is no evidence of acute infarct, midline shift, or  extra-axial fluid collection. Mild generalized cerebral atrophy is within normal limits for age. Scattered small foci of T2 hyperintensity in the cerebral white matter and pons are nonspecific but compatible with minimal chronic small vessel ischemic disease. There are multiple enhancing brain lesions, many of which are hemorrhagic. Two lesions are present in the right cerebellum, the larger measuring 10 mm (series 14, image 46). There are 8 supratentorial lesions, with the largest  measuring 15 mm in the left middle frontal gyrus (series 14, image 132). There is mild edema associated with the larger cerebellar lesion and with a few of the cerebral lesions, without significant mass effect. Vascular: Major intracranial vascular flow voids are preserved. Skull and upper cervical spine: No suspicious marrow lesion. Moderate cervical disc degeneration. Sinuses/Orbits: Unremarkable orbits. Paranasal sinuses and mastoid air cells are clear. Other: 1.4 cm mass within or immediately posterior to the left parotid gland, hypermetabolic on today's PET-CT. Smaller 6 mm nodule more inferiorly along the posterior left parotid gland. IMPRESSION: 1. Ten hemorrhagic brain lesions consistent with metastases. At most mild edema without mass effect. 2. 1.4 cm left parotid/periparotid mass which may reflect primary parotid neoplasm or possibly metastatic disease. Electronically Signed   By: Logan Bores M.D.   On: 12/14/2016 10:24   Nm Pet Image Initial (pi) Skull Base To Thigh  Result Date: 12/14/2016 CLINICAL DATA:  Initial treatment strategy for non-small-cell lung cancer. EXAM: NUCLEAR MEDICINE PET SKULL BASE TO THIGH TECHNIQUE: 12.8 mCi F-18 FDG was injected intravenously. Full-ring PET imaging was performed from the skull base to thigh after the radiotracer. CT data was obtained and used for attenuation correction and anatomic localization. FASTING BLOOD GLUCOSE:  Value: 98 mg/dl COMPARISON:  CT scan 10/02/2016 FINDINGS: NECK:  Hypermetabolic nodule identified along or in the posterior aspect the left parotid gland measures 7 mm short axis with SUV max = 5.7 CHEST: Left hilar lymphadenopathy is markedly hypermetabolic with SUV max = 23.7. The confluent soft tissue attenuation in the left lower lobe is diffusely hypermetabolic with SUV max = 13. There is hypermetabolic some parietal lymphadenopathy. Hypermetabolic AP window lymphadenopathy demonstrates SUV max = 14.4. Several hypermetabolic subcutaneous nodules are identified in the chest. 15 mm left lateral chest wall nodule in the mid axillary line (image 117 series 4) is hypermetabolic with SUV max = 9.5. Hypermetabolic sub xiphoid nodule in the anterior midline (image 139) demonstrates SUV max = 12.3. Tiny subcutaneous nodules in the posterior left upper back (image 35 series 4) are hypermetabolic. ABDOMEN/PELVIS: 10 mm exophytic lesion upper pole left kidney is hypermetabolic with SUV max = 5.2. Tiny calcified gallstones are evident. There is abdominal aortic atherosclerosis without aneurysm. SKELETON: No focal hypermetabolic activity to suggest skeletal metastasis. IMPRESSION: 1. Marked hypermetabolism in the left lower lobe with hypermetabolic lymphadenopathy in the left hilum and mediastinum. No hypermetabolism in the right hilum on today's study. 2. Several hypermetabolic subcutaneous nodules in the chest suggest metastatic involvement. 3. Hypermetabolic soft tissue nodule adjacent to or in the posterior aspect of the left parotid gland may represent metastatic disease or primary parotid neoplasm. 4. 10 mm exophytic lesion upper pole left kidney demonstrates hypermetabolism. Renal cell carcinoma could have this appearance. Abdominal MRI without and with contrast would be the study of choice to further evaluate. 5.  Aortic Atherosclerois (ICD10-170.0) Electronically Signed   By: Misty Stanley M.D.   On: 12/14/2016 09:54     Assessment and plan- Patient is a 66 y.o. male with  stage IV non-small cell lung cancer cT2cN2M1b with brain and subcutaneous metastases  I personally reviewed his PET/CT images as well as MRI brain and have discussed the results with the patient. Unfortunately patient has evidence of stage IV lung cancer given evidence of subcutaneous metastases as well as brain metastases he will be meeting with Dr. Baruch Gouty today from radiation oncology to discuss whole brain radiation. Foundation one testing on his oncology has been sent out and is  currently pending. I would favor that he completes whole brain radiation before we get started with systemic therapy. I discussed that treatment at this time for his only palliative and not curative to shrink his tumor and improve his quality of life and longevity. if during the course of treatment patient is unable to tolerate treatment or is having significant side effects from treatment or not responding to treatment we will be stopping treatment at that point.   Patient lives alone and has a performance status of 2 and i do not think that he would be a candidate for combination chemotherapy immunotherapy. If PDL 1 expression and his tumor is greater than 50% and I will favor upfront immunotherapy. If PDL 1 expression is less than 15% I will favor upfront systemic chemotherapy with carboplatin and Taxol. If he has any evidence of actionable mutations we could use that as first line therapy as well. At this time I will proceed with port placement and I will see him tentatively in 10 days' time to discuss the results of his Foundation one testing and further management.  Neoplasm related pain- I have as the patient to go up on his oxycodone to use 5 mg every 6 hours as needed for pain and he will be using MiraLAX and senna for opioid associated constipation. I will reassess his pain in a week's time and if his pain remains uncontrolled I will consider increasing his short-acting pain medication or adding a long-acting pain  medication at that time  I also discussed his CODE STATUS and treatment preferences. Patient does have advanced stage IV incurable lung cancer. Without any treatment and his prognosis is less than 6 months. With treatment it could be a year or longer. Patient understands and would like to be DNR/DNI. MOST form has been updated to reflect patients wishes    Patient also has a renal mass noted on PET but in the light of stage IV lung cancer, this does not need further work up  Home palliative care consult has also been placed    Total face to face encounter time for this patient visit was 45 min. >50% of the time was  spent in counseling and coordination of care.       Visit Diagnosis 1. Malignant neoplasm of unspecified part of unspecified bronchus or lung ([place])   2. Neoplasm related pain   3. Therapeutic opioid induced constipation   4. Goals of care, counseling/discussion      Dr. Randa Evens, MD, MPH [facility] at [facility] Pager- [phone] 12/15/2016 12:24 PM

## 2016-12-15 NOTE — Discharge Instructions (Signed)
Implanted Port Insertion, Care After °This sheet gives you information about how to care for yourself after your procedure. Your health care provider may also give you more specific instructions. If you have problems or questions, contact your health care provider. °What can I expect after the procedure? °After your procedure, it is common to have: °· Discomfort at the port insertion site. °· Bruising on the skin over the port. This should improve over 3-4 days. ° °Follow these instructions at home: °Port care °· After your port is placed, you will get a manufacturer's information card. The card has information about your port. Keep this card with you at all times. °· Take care of the port as told by your health care provider. Ask your health care provider if you or a family member can get training for taking care of the port at home. A home health care nurse may also take care of the port. °· Make sure to remember what type of port you have. °Incision care °· Follow instructions from your health care provider about how to take care of your port insertion site. Make sure you: °? Wash your hands with soap and water before you change your bandage (dressing). If soap and water are not available, use hand sanitizer. °? Change your dressing as told by your health care provider. °? Leave stitches (sutures), skin glue, or adhesive strips in place. These skin closures may need to stay in place for 2 weeks or longer. If adhesive strip edges start to loosen and curl up, you may trim the loose edges. Do not remove adhesive strips completely unless your health care provider tells you to do that. °· Check your port insertion site every day for signs of infection. Check for: °? More redness, swelling, or pain. °? More fluid or blood. °? Warmth. °? Pus or a bad smell. °General instructions °· Do not take baths, swim, or use a hot tub until your health care provider approves. °· Do not lift anything that is heavier than 10 lb (4.5  kg) for a week, or as told by your health care provider. °· Ask your health care provider when it is okay to: °? Return to work or school. °? Resume usual physical activities or sports. °· Do not drive for 24 hours if you were given a medicine to help you relax (sedative). °· Take over-the-counter and prescription medicines only as told by your health care provider. °· Wear a medical alert bracelet in case of an emergency. This will tell any health care providers that you have a port. °· Keep all follow-up visits as told by your health care provider. This is important. °Contact a health care provider if: °· You cannot flush your port with saline as directed, or you cannot draw blood from the port. °· You have a fever or chills. °· You have more redness, swelling, or pain around your port insertion site. °· You have more fluid or blood coming from your port insertion site. °· Your port insertion site feels warm to the touch. °· You have pus or a bad smell coming from the port insertion site. °Get help right away if: °· You have chest pain or shortness of breath. °· You have bleeding from your port that you cannot control. °Summary °· Take care of the port as told by your health care provider. °· Change your dressing as told by your health care provider. °· Keep all follow-up visits as told by your health care provider. °  This information is not intended to replace advice given to you by your health care provider. Make sure you discuss any questions you have with your health care provider. °Document Released: 11/28/2012 Document Revised: 12/30/2015 Document Reviewed: 12/30/2015 °Elsevier Interactive Patient Education © 2017 [employer] ° °

## 2016-12-15 NOTE — Procedures (Signed)
Interventional Radiology Procedure Note  Procedure: Placement of a right IJ approach single lumen PowerPort.  Tip is positioned at the superior cavoatrial junction and catheter is ready for immediate use.  Complications: No immediate Recommendations:  - Ok to shower tomorrow - Do not submerge for 7 days - Routine line care   Nolyn Swab T. Draven Natter, M.D Pager:  [phone]   

## 2016-12-15 NOTE — Consult Note (Signed)
NEW PATIENT EVALUATION  Name: Bradley Frederick  MRN: [id number]  Date:   12/15/2016     DOB: 12-19-1950   This 66 y.o. 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  REFERRING PHYSICIAN: Marygrace Drought, MD  CHIEF COMPLAINT: No chief complaint on file.   DIAGNOSIS: The encounter diagnosis was Brain metastasis ([place]).   PREVIOUS INVESTIGATIONS:  MRI scan of brain CT scan PET CT scans all reviewed Pathology report reviewed Clinical notes reviewed  HPI: Patient is a 66 year old male who originally presented back in August 2018 with increasing shortness of breath and cough and admitted to [facility]. At that time chest CT scan showed a 3.4 cm left hilar mass with pulmonary artery encasement. He was seen as an outpatient by pulmonology underwent bronchoscopy showing obstructing mass in the left lower lobe with biopsy positive for non-small cell lung cancer differentiation between adenocarcinoma Sequim cell carcinoma cannot be performed. PET CT scan demonstrated hypermetabolic activity in the left lower lobe with hypermetabolic adenopathy in the left hilum and mediastinum. He also hypermetabolic subcutaneous nodules suggestive of metastatic disease. Also there was a 1 cm exophytic mass in the left kidney demonstrating hypermetabolic activity renal cell carcinoma cannot be ruled out. Patient underwent MRI scan of his brain showing 10 hemorrhagic brain lesions consistent with metastatic disease. Incidentally there is also a 1.4 cm left parotid mass which also may represent metastatic disease. He is seen today for evaluation of his brain metastasis. He has not been started on steroid therapy. He does have some mild headaches. He specifically denies any focal neurologic deficits change in visual fields.  PLANNED TREATMENT REGIMEN: Whole brain radiation  PAST MEDICAL HISTORY:  has a past medical history of Anxiety; Arthritis; Carpal  tunnel syndrome, bilateral; COPD (chronic obstructive pulmonary disease) ([place]); Depression; Diverticulosis; Dyspnea; GERD (gastroesophageal reflux disease); Headache; IBS (irritable bowel syndrome); Occasional tremors; On home oxygen therapy; Peripheral neuropathy; Seizures ([place]) (2001); Sleep apnea; Vertigo; and Vitamin B 12 deficiency.    PAST SURGICAL HISTORY:  Past Surgical History:  Procedure Laterality Date  . BACK SURGERY     Lumbat Laminectomy  . BACK SURGERY     Spinal Fusion  . ENDOBRONCHIAL ULTRASOUND N/A 12/01/2016   Procedure: ENDOBRONCHIAL ULTRASOUND;  Surgeon: [name], MD;  Location: [facility] ORS;  Service: Cardiopulmonary;  Laterality: N/A;  . TONSILLECTOMY      FAMILY HISTORY: family history includes Cancer in his paternal aunt and paternal grandmother; Dementia in his mother; Parkinson's disease in his father.  SOCIAL HISTORY:  reports that he has been smoking Cigarettes.  He has a 50.00 pack-year smoking history. He has never used smokeless tobacco. He reports that he drinks alcohol. He reports that he does not use drugs.  ALLERGIES: Penicillins  MEDICATIONS:  Current Outpatient Prescriptions  Medication Sig Dispense Refill  . ADVAIR DISKUS 250-50 MCG/DOSE AEPB Inhale 1 puff into the lungs every 12 (twelve) hours.    Marland Kitchen aspirin EC 81 MG tablet Take 81 mg by mouth daily.    . bisacodyl ([employer] GENTLE LAXATIVE) 5 MG EC tablet Take 5-10 mg by mouth daily as needed for moderate constipation (FOR CONSTIPATION).    Marland Kitchen dexamethasone (DECADRON) 4 MG tablet Take 1 tablet (4 mg total) by mouth daily. 30 tablet 0  . doxazosin (CARDURA) 4 MG tablet Take 4 mg by mouth daily.    . finasteride (PROSCAR) 5 MG tablet Take 5 mg by mouth daily.    Marland Kitchen  gabapentin (NEURONTIN) 300 MG capsule Take 300 mg by mouth 3 (three) times daily.    Marland Kitchen ipratropium-albuterol (DUONEB) 0.5-2.5 (3) MG/3ML SOLN Take 3 mLs by nebulization every 6 (six) hours as needed.    Marland Kitchen LORazepam (ATIVAN) 0.5 MG tablet Take  0.5 mg by mouth 2 (two) times daily.    . nicotine (NICODERM CQ - DOSED IN MG/24 HOURS) 14 mg/24hr patch Place 1 patch (14 mg total) onto the skin daily. (Patient not taking: Reported on 12/07/2016) 30 patch 0  . oxyCODONE (OXY IR/ROXICODONE) 5 MG immediate release tablet Take 1 tablet (5 mg total) by mouth every 6 (six) hours as needed for severe pain. 60 tablet 0  . ranitidine (ZANTAC) 150 MG tablet Take 150 mg by mouth 2 (two) times daily as needed (for gerd/ulcers.).     Marland Kitchen sertraline (ZOLOFT) 100 MG tablet Take 100 mg by mouth daily.    Marland Kitchen tiZANidine (ZANAFLEX) 4 MG tablet Take 4 mg by mouth 3 (three) times daily.    . VENTOLIN HFA 108 (90 Base) MCG/ACT inhaler Inhale 2 puffs into the lungs every 6 (six) hours as needed for wheezing or shortness of breath.      No current facility-administered medications for this encounter.     ECOG PERFORMANCE STATUS:  1 - Symptomatic but completely ambulatory  REVIEW OF SYSTEMS: Patient does have some mild headaches cough and other pulmonary symptoms as described above otherwise Patient denies any weight loss, fatigue, weakness, fever, chills or night sweats. Patient denies any loss of vision, blurred vision. Patient denies any ringing  of the ears or hearing loss. No irregular heartbeat. Patient denies heart murmur or history of fainting. Patient denies any chest pain or pain radiating to her upper extremities. Patient denies any shortness of breath, difficulty breathing at night, cough or hemoptysis. Patient denies any swelling in the lower legs. Patient denies any nausea vomiting, vomiting of blood, or coffee ground material in the vomitus. Patient denies any stomach pain. Patient states has had normal bowel movements no significant constipation or diarrhea. Patient denies any dysuria, hematuria or significant nocturia. Patient denies any problems walking, swelling in the joints or loss of balance. Patient denies any skin changes, loss of hair or loss of  weight. Patient denies any excessive worrying or anxiety or significant depression. Patient denies any problems with insomnia. Patient denies excessive thirst, polyuria, polydipsia. Patient denies any swollen glands, patient denies easy bruising or easy bleeding. Patient denies any recent infections, allergies or URI. Patient "s visual fields have not changed significantly in recent time.    PHYSICAL EXAM: There were no vitals taken for this visit. A well-developed male wheelchair-bound in NAD. Crude visual fields are within normal range. Well-developed well-nourished patient in NAD. HEENT reveals PERLA, EOMI, discs not visualized.  Oral cavity is clear. No oral mucosal lesions are identified. Neck is clear without evidence of cervical or supraclavicular adenopathy. Lungs are clear to A&P. Cardiac examination is essentially unremarkable with regular rate and rhythm without murmur rub or thrill. Abdomen is benign with no organomegaly or masses noted. Motor sensory and DTR levels are equal and symmetric in the upper and lower extremities. Cranial nerves II through XII are grossly intact. Proprioception is intact. No peripheral adenopathy or edema is identified. No motor or sensory levels are noted. Crude visual fields are within normal range.  LABORATORY DATA: Pathology reports reviewed    RADIOLOGY RESULTS: PET CT scan CT scans and MRI of brain all reviewed and compatible with  the above-stated findings   IMPRESSION: Stage IV lung cancer with brain metastasis in 66 year old male  PLAN: At this time like to go ahead with whole brain radiation therapy. I would plan on delivering 3000 cGy in 10 fractions. I have personally set up and ordered CT simulation for first thing next week. I'm starting the patient on 4 mg of Decadron once a day. Risks and benefits of treatment including hair loss fatigue alteration of blood counts possible slight chance of cognitive decline all were discussed in detail with the  patient. Patient seems to comprehend my treatment plan well. We also may at some point want to touch up his chest with palliative radiation therapy depending on his response to systemic treatment.  I would like to take this opportunity to thank you for allowing me to participate in the care of your patient.Armstead Peaks., MD

## 2016-12-16 ENCOUNTER — Emergency Department: Payer: Medicare Other

## 2016-12-16 ENCOUNTER — Other Ambulatory Visit: Payer: Self-pay

## 2016-12-16 ENCOUNTER — Inpatient Hospital Stay
Admission: EM | Admit: 2016-12-16 | Discharge: 2016-12-20 | DRG: 565 | Disposition: A | Discharge status: Hospice | Payer: Medicare Other | Attending: Internal Medicine | Admitting: Internal Medicine

## 2016-12-16 ENCOUNTER — Inpatient Hospital Stay: Payer: Medicare Other | Admitting: Oncology

## 2016-12-16 DIAGNOSIS — Z7952 Long term (current) use of systemic steroids: Secondary | ICD-10-CM

## 2016-12-16 DIAGNOSIS — G4730 Sleep apnea, unspecified: Secondary | ICD-10-CM | POA: Diagnosis present

## 2016-12-16 DIAGNOSIS — Z66 Do not resuscitate: Secondary | ICD-10-CM | POA: Diagnosis present

## 2016-12-16 DIAGNOSIS — T796XXA Traumatic ischemia of muscle, initial encounter: Secondary | ICD-10-CM | POA: Diagnosis not present

## 2016-12-16 DIAGNOSIS — G8929 Other chronic pain: Secondary | ICD-10-CM | POA: Diagnosis present

## 2016-12-16 DIAGNOSIS — F17210 Nicotine dependence, cigarettes, uncomplicated: Secondary | ICD-10-CM | POA: Diagnosis present

## 2016-12-16 DIAGNOSIS — F419 Anxiety disorder, unspecified: Secondary | ICD-10-CM | POA: Diagnosis not present

## 2016-12-16 DIAGNOSIS — T148XXA Other injury of unspecified body region, initial encounter: Secondary | ICD-10-CM | POA: Diagnosis not present

## 2016-12-16 DIAGNOSIS — N400 Enlarged prostate without lower urinary tract symptoms: Secondary | ICD-10-CM | POA: Diagnosis present

## 2016-12-16 DIAGNOSIS — I629 Nontraumatic intracranial hemorrhage, unspecified: Secondary | ICD-10-CM

## 2016-12-16 DIAGNOSIS — Z981 Arthrodesis status: Secondary | ICD-10-CM | POA: Diagnosis not present

## 2016-12-16 DIAGNOSIS — Z515 Encounter for palliative care: Secondary | ICD-10-CM | POA: Diagnosis not present

## 2016-12-16 DIAGNOSIS — S0003XA Contusion of scalp, initial encounter: Secondary | ICD-10-CM | POA: Diagnosis present

## 2016-12-16 DIAGNOSIS — C3490 Malignant neoplasm of unspecified part of unspecified bronchus or lung: Secondary | ICD-10-CM | POA: Diagnosis present

## 2016-12-16 DIAGNOSIS — Z7189 Other specified counseling: Secondary | ICD-10-CM | POA: Diagnosis not present

## 2016-12-16 DIAGNOSIS — W19XXXA Unspecified fall, initial encounter: Secondary | ICD-10-CM

## 2016-12-16 DIAGNOSIS — S40012A Contusion of left shoulder, initial encounter: Secondary | ICD-10-CM | POA: Diagnosis present

## 2016-12-16 DIAGNOSIS — Z79899 Other long term (current) drug therapy: Secondary | ICD-10-CM

## 2016-12-16 DIAGNOSIS — Y92009 Unspecified place in unspecified non-institutional (private) residence as the place of occurrence of the external cause: Secondary | ICD-10-CM | POA: Diagnosis not present

## 2016-12-16 DIAGNOSIS — C7931 Secondary malignant neoplasm of brain: Secondary | ICD-10-CM | POA: Diagnosis present

## 2016-12-16 DIAGNOSIS — H02401 Unspecified ptosis of right eyelid: Secondary | ICD-10-CM | POA: Diagnosis present

## 2016-12-16 DIAGNOSIS — Z8719 Personal history of other diseases of the digestive system: Secondary | ICD-10-CM | POA: Diagnosis not present

## 2016-12-16 DIAGNOSIS — J449 Chronic obstructive pulmonary disease, unspecified: Secondary | ICD-10-CM | POA: Diagnosis present

## 2016-12-16 DIAGNOSIS — S8002XA Contusion of left knee, initial encounter: Secondary | ICD-10-CM | POA: Diagnosis present

## 2016-12-16 DIAGNOSIS — Z820 Family history of epilepsy and other diseases of the nervous system: Secondary | ICD-10-CM

## 2016-12-16 DIAGNOSIS — W010XXA Fall on same level from slipping, tripping and stumbling without subsequent striking against object, initial encounter: Secondary | ICD-10-CM | POA: Diagnosis present

## 2016-12-16 DIAGNOSIS — C3432 Malignant neoplasm of lower lobe, left bronchus or lung: Secondary | ICD-10-CM | POA: Diagnosis not present

## 2016-12-16 DIAGNOSIS — R627 Adult failure to thrive: Secondary | ICD-10-CM | POA: Diagnosis present

## 2016-12-16 DIAGNOSIS — Z9981 Dependence on supplemental oxygen: Secondary | ICD-10-CM | POA: Diagnosis not present

## 2016-12-16 DIAGNOSIS — Y92019 Unspecified place in single-family (private) house as the place of occurrence of the external cause: Secondary | ICD-10-CM | POA: Diagnosis not present

## 2016-12-16 DIAGNOSIS — S8001XA Contusion of right knee, initial encounter: Secondary | ICD-10-CM | POA: Diagnosis present

## 2016-12-16 DIAGNOSIS — M129 Arthropathy, unspecified: Secondary | ICD-10-CM | POA: Diagnosis not present

## 2016-12-16 DIAGNOSIS — G9349 Other encephalopathy: Secondary | ICD-10-CM | POA: Diagnosis present

## 2016-12-16 DIAGNOSIS — T07XXXA Unspecified multiple injuries, initial encounter: Secondary | ICD-10-CM

## 2016-12-16 DIAGNOSIS — Y939 Activity, unspecified: Secondary | ICD-10-CM | POA: Diagnosis not present

## 2016-12-16 DIAGNOSIS — F329 Major depressive disorder, single episode, unspecified: Secondary | ICD-10-CM | POA: Diagnosis not present

## 2016-12-16 DIAGNOSIS — M6282 Rhabdomyolysis: Secondary | ICD-10-CM | POA: Diagnosis present

## 2016-12-16 DIAGNOSIS — J9819 Other pulmonary collapse: Secondary | ICD-10-CM | POA: Diagnosis present

## 2016-12-16 DIAGNOSIS — S0083XA Contusion of other part of head, initial encounter: Secondary | ICD-10-CM | POA: Diagnosis present

## 2016-12-16 DIAGNOSIS — Z880 Allergy status to penicillin: Secondary | ICD-10-CM | POA: Diagnosis not present

## 2016-12-16 HISTORY — DX: Malignant neoplasm of unspecified part of unspecified bronchus or lung: C34.90

## 2016-12-16 HISTORY — DX: Malignant neoplasm of brain, unspecified: C71.9

## 2016-12-16 LAB — COMPREHENSIVE METABOLIC PANEL
ALBUMIN: 2.6 g/dL — AB (ref 3.5–5.0)
ALK PHOS: 90 U/L (ref 38–126)
ALT: 22 U/L (ref 17–63)
AST: 51 U/L — AB (ref 15–41)
Anion gap: 11 (ref 5–15)
BILIRUBIN TOTAL: 1 mg/dL (ref 0.3–1.2)
BUN: 13 mg/dL (ref 6–20)
CALCIUM: 9.2 mg/dL (ref 8.9–10.3)
CO2: 31 mmol/L (ref 22–32)
Chloride: 92 mmol/L — ABNORMAL LOW (ref 101–111)
Creatinine, Ser: 0.71 mg/dL (ref 0.61–1.24)
GFR calc Af Amer: >60 mL/min (ref >60)
GFR calc non Af Amer: >60 mL/min (ref >60)
GLUCOSE: 103 mg/dL — AB (ref 65–99)
POTASSIUM: 3.5 mmol/L (ref 3.5–5.1)
Sodium: 134 mmol/L — ABNORMAL LOW (ref 135–145)
TOTAL PROTEIN: 7.5 g/dL (ref 6.5–8.1)

## 2016-12-16 LAB — LACTIC ACID, PLASMA: Lactic Acid, Venous: 0.7 mmol/L (ref 0.5–1.9)

## 2016-12-16 LAB — CBC WITH DIFFERENTIAL/PLATELET
BASOS ABS: 0.1 10*3/uL (ref 0–0.1)
Basophils Relative: 1 %
EOS ABS: 0 10*3/uL (ref 0–0.7)
Eosinophils Relative: 0 %
HEMATOCRIT: 38.9 % — AB (ref 40.0–52.0)
HEMOGLOBIN: 12.6 g/dL — AB (ref 13.0–18.0)
LYMPHS ABS: 0.6 10*3/uL — AB (ref 1.0–3.6)
Lymphocytes Relative: 5 %
MCH: 28 pg (ref 26.0–34.0)
MCHC: 32.3 g/dL (ref 32.0–36.0)
MCV: 86.6 fL (ref 80.0–100.0)
MONOS PCT: 7 %
Monocytes Absolute: 0.8 10*3/uL (ref 0.2–1.0)
NEUTROS PCT: 87 %
Neutro Abs: 10.2 10*3/uL — ABNORMAL HIGH (ref 1.4–6.5)
Platelets: 397 10*3/uL (ref 150–440)
RBC: 4.49 MIL/uL (ref 4.40–5.90)
RDW: 14.4 % (ref 11.5–14.5)
WBC: 11.6 10*3/uL — ABNORMAL HIGH (ref 3.8–10.6)

## 2016-12-16 LAB — TROPONIN I: Troponin I: 0.04 ng/mL (ref <0.03)

## 2016-12-16 LAB — CK: Total CK: 1068 U/L — ABNORMAL HIGH (ref 49–397)

## 2016-12-16 MED ORDER — OXYCODONE HCL 5 MG PO TABS
5.0000 mg | ORAL_TABLET | Freq: Four times a day (QID) | ORAL | Status: DC | PRN
  Administered 2016-12-16 – 2016-12-18 (×2): 5 mg via ORAL
  Filled 2016-12-16 (×2): qty 1

## 2016-12-16 MED ORDER — FAMOTIDINE 20 MG PO TABS
20.0000 mg | ORAL_TABLET | Freq: Two times a day (BID) | ORAL | Status: DC
  Administered 2016-12-17 – 2016-12-18 (×4): 20 mg via ORAL
  Filled 2016-12-16 (×4): qty 1

## 2016-12-16 MED ORDER — SODIUM CHLORIDE 0.9 % IV SOLN
INTRAVENOUS | Status: DC
  Administered 2016-12-16 – 2016-12-19 (×6): via INTRAVENOUS

## 2016-12-16 MED ORDER — DOXAZOSIN MESYLATE 4 MG PO TABS
4.0000 mg | ORAL_TABLET | Freq: Every day | ORAL | Status: DC
  Administered 2016-12-17 – 2016-12-18 (×2): 4 mg via ORAL
  Filled 2016-12-16 (×3): qty 1

## 2016-12-16 MED ORDER — DULOXETINE HCL 30 MG PO CPEP
60.0000 mg | ORAL_CAPSULE | Freq: Every day | ORAL | Status: DC
  Administered 2016-12-17 – 2016-12-18 (×2): 60 mg via ORAL
  Filled 2016-12-16 (×2): qty 2

## 2016-12-16 MED ORDER — IPRATROPIUM-ALBUTEROL 0.5-2.5 (3) MG/3ML IN SOLN: 3.0000 mL | Freq: Four times a day (QID) | RESPIRATORY_TRACT | Status: DC | PRN

## 2016-12-16 MED ORDER — NICOTINE 14 MG/24HR TD PT24
14.0000 mg | MEDICATED_PATCH | Freq: Every day | TRANSDERMAL | Status: DC
  Administered 2016-12-16 – 2016-12-19 (×4): 14 mg via TRANSDERMAL
  Filled 2016-12-16 (×4): qty 1

## 2016-12-16 MED ORDER — DEXAMETHASONE SODIUM PHOSPHATE 10 MG/ML IJ SOLN
10.0000 mg | Freq: Once | INTRAMUSCULAR | Status: AC
  Administered 2016-12-16: 10 mg via INTRAVENOUS
  Filled 2016-12-16: qty 1

## 2016-12-16 MED ORDER — FINASTERIDE 5 MG PO TABS
5.0000 mg | ORAL_TABLET | Freq: Every day | ORAL | Status: DC
  Administered 2016-12-17 – 2016-12-18 (×2): 5 mg via ORAL
  Filled 2016-12-16 (×2): qty 1

## 2016-12-16 MED ORDER — MORPHINE SULFATE (PF) 2 MG/ML IV SOLN
2.0000 mg | INTRAVENOUS | Status: DC | PRN
  Administered 2016-12-17: 17:00:00 2 mg via INTRAVENOUS
  Administered 2016-12-19: 4 mg via INTRAVENOUS
  Filled 2016-12-16: qty 2
  Filled 2016-12-16: qty 1

## 2016-12-16 MED ORDER — MOMETASONE FURO-FORMOTEROL FUM 200-5 MCG/ACT IN AERO
2.0000 | INHALATION_SPRAY | Freq: Two times a day (BID) | RESPIRATORY_TRACT | Status: DC
  Administered 2016-12-17 – 2016-12-18 (×4): 2 via RESPIRATORY_TRACT
  Filled 2016-12-16: qty 8.8

## 2016-12-16 MED ORDER — DEXAMETHASONE 4 MG PO TABS
4.0000 mg | ORAL_TABLET | Freq: Every day | ORAL | Status: DC
  Administered 2016-12-17 – 2016-12-18 (×2): 4 mg via ORAL
  Filled 2016-12-16 (×2): qty 1

## 2016-12-16 MED ORDER — SERTRALINE HCL 50 MG PO TABS
100.0000 mg | ORAL_TABLET | Freq: Every day | ORAL | Status: DC
  Administered 2016-12-17 – 2016-12-18 (×2): 100 mg via ORAL
  Filled 2016-12-16 (×2): qty 2

## 2016-12-16 MED ORDER — LORAZEPAM 0.5 MG PO TABS
0.5000 mg | ORAL_TABLET | Freq: Two times a day (BID) | ORAL | Status: DC
  Administered 2016-12-16 – 2016-12-18 (×5): 0.5 mg via ORAL
  Filled 2016-12-16 (×5): qty 1

## 2016-12-16 MED ORDER — PROCHLORPERAZINE EDISYLATE 5 MG/ML IJ SOLN
10.0000 mg | Freq: Four times a day (QID) | INTRAMUSCULAR | Status: DC | PRN
  Filled 2016-12-16: qty 2

## 2016-12-16 MED ORDER — TIZANIDINE HCL 4 MG PO TABS
4.0000 mg | ORAL_TABLET | Freq: Three times a day (TID) | ORAL | Status: DC
  Administered 2016-12-17 – 2016-12-18 (×6): 4 mg via ORAL
  Filled 2016-12-16 (×11): qty 1

## 2016-12-16 MED ORDER — DOCUSATE SODIUM 100 MG PO CAPS
100.0000 mg | ORAL_CAPSULE | Freq: Two times a day (BID) | ORAL | Status: DC
  Administered 2016-12-17 – 2016-12-18 (×4): 100 mg via ORAL
  Filled 2016-12-16 (×4): qty 1

## 2016-12-16 MED ORDER — GABAPENTIN 300 MG PO CAPS
300.0000 mg | ORAL_CAPSULE | Freq: Three times a day (TID) | ORAL | Status: DC
  Administered 2016-12-16 – 2016-12-18 (×7): 300 mg via ORAL
  Filled 2016-12-16 (×7): qty 1

## 2016-12-16 MED ORDER — ONDANSETRON HCL 4 MG/2ML IJ SOLN: 4.0000 mg | Freq: Four times a day (QID) | INTRAMUSCULAR | Status: DC | PRN

## 2016-12-16 MED ORDER — ACETAMINOPHEN 325 MG PO TABS
650.0000 mg | ORAL_TABLET | Freq: Four times a day (QID) | ORAL | Status: DC | PRN
  Administered 2016-12-17: 650 mg via ORAL
  Filled 2016-12-16: qty 2

## 2016-12-16 MED ORDER — ONDANSETRON HCL 4 MG PO TABS: 4.0000 mg | ORAL_TABLET | Freq: Four times a day (QID) | ORAL | Status: DC | PRN

## 2016-12-16 MED ORDER — NICOTINE 14 MG/24HR TD PT24: 14.0000 mg | MEDICATED_PATCH | Freq: Every day | TRANSDERMAL | Status: DC

## 2016-12-16 MED ORDER — ACETAMINOPHEN 650 MG RE SUPP: 650.0000 mg | Freq: Four times a day (QID) | RECTAL | Status: DC | PRN

## 2016-12-16 MED ORDER — SODIUM CHLORIDE 0.9 % IV BOLUS (SEPSIS)
1000.0000 mL | Freq: Once | INTRAVENOUS | Status: AC
  Administered 2016-12-16: 1000 mL via INTRAVENOUS

## 2016-12-16 MED ORDER — SODIUM CHLORIDE 0.9 % IV SOLN
1000.0000 mg | Freq: Once | INTRAVENOUS | Status: AC
  Administered 2016-12-16: 1000 mg via INTRAVENOUS
  Filled 2016-12-16: qty 10

## 2016-12-16 NOTE — ED Notes (Addendum)
Pt desat to 89% on 2L O2 via n/c - good wave form noted - increased O2 to 3L and o2 sat improved to 94%

## 2016-12-16 NOTE — ED Provider Notes (Signed)
[facility] Emergency Department Provider Note ____________________________________________   I have reviewed the triage vital signs and the triage nursing note.  HISTORY  Chief Complaint Fall   Historian Level 5 Caveat History Limited by patient somewhat a poor historian Family member provides some additional history  HPI Bradley Frederick is a 66 y.o. male from his own apartment, where he lives alone, wears 2 L nasal cannula oxygen for both COPD and recent diagnosis static lung cancer with metastases to the brain, presents due to fall.  Unclear exactly what time, but [employer] was not able to deliver this morning around 9 AM, and daughter apparently checked on him this evening and found him on the floor.  Patient tells me that he tripped on the oxygen tubing, however he is a bit of a poor historian.  He has chronic pain is not reporting any new specific pain, but he does have bruises to his head, left shoulder, both knees, and both forearms.  He apparently was diagnosed yesterday with recurrent and now metastatic lung cancer with metastases to the brain.  This diagnosis was just yesterday.  He is not on antiseizure nor Decadron medication.  They do not report new or change trouble breathing, although state nothing was stated as far as junky sounding lungs yesterday.     Past Medical History:  Diagnosis Date  . Anxiety   . Arthritis   . Carpal tunnel syndrome, bilateral   . COPD (chronic obstructive pulmonary disease) ([place])   . Depression   . Diverticulosis   . Dyspnea   . GERD (gastroesophageal reflux disease)   . Headache   . IBS (irritable bowel syndrome)   . Occasional tremors   . On home oxygen therapy    2 L/M vai nasal cannula  . Peripheral neuropathy   . Seizures ([place]) 8469   alcoholic seizure x 1  . Sleep apnea    NO C-PAP  . Vertigo    occ  . Vitamin B 12 deficiency     Patient Active Problem List   Diagnosis Date Noted  .  Neoplasm related pain 12/15/2016  . Therapeutic opioid induced constipation 12/15/2016  . Malignant neoplasm of unspecified part of unspecified bronchus or lung ([place]) 12/09/2016  . Lung mass   . Acute respiratory failure with hypoxia ([place])   . Palliative care encounter   . Goals of care, counseling/discussion   . COPD exacerbation ([place]) 10/02/2016    Past Surgical History:  Procedure Laterality Date  . BACK SURGERY     Lumbat Laminectomy  . BACK SURGERY     Spinal Fusion  . ENDOBRONCHIAL ULTRASOUND N/A 12/01/2016   Procedure: ENDOBRONCHIAL ULTRASOUND;  Surgeon: [name], MD;  Location: [facility] ORS;  Service: Cardiopulmonary;  Laterality: N/A;  . IR FLUORO GUIDE PORT INSERTION RIGHT  12/15/2016  . TONSILLECTOMY      Prior to Admission medications   Medication Sig Start Date End Date Taking? Authorizing Provider  ADVAIR DISKUS 250-50 MCG/DOSE AEPB Inhale 1 puff into the lungs every 12 (twelve) hours. 09/27/16   [provider]  aspirin EC 81 MG tablet Take 81 mg by mouth daily.    [provider]  bisacodyl ([employer] GENTLE LAXATIVE) 5 MG EC tablet Take 5-10 mg by mouth daily as needed for moderate constipation (FOR CONSTIPATION).    [provider]  dexamethasone (DECADRON) 4 MG tablet Take 1 tablet (4 mg total) by mouth daily. 12/15/16   Noreene Filbert, MD  doxazosin (CARDURA) 4 MG tablet Take 4 mg by mouth daily. 08/31/16   [provider]  finasteride (PROSCAR) 5 MG tablet Take 5 mg by mouth daily. 08/31/16   [provider]  gabapentin (NEURONTIN) 300 MG capsule Take 300 mg by mouth 3 (three) times daily. 10/21/16   [provider]  ipratropium-albuterol (DUONEB) 0.5-2.5 (3) MG/3ML SOLN Take 3 mLs by nebulization every 6 (six) hours as needed.    [provider]  LORazepam (ATIVAN) 0.5 MG tablet Take 0.5 mg by mouth 2 (two) times daily. 10/21/16   [provider]  nicotine (NICODERM CQ - DOSED IN MG/24 HOURS) 14  mg/24hr patch Place 1 patch (14 mg total) onto the skin daily. Patient not taking: Reported on 12/07/2016 10/06/16   Hillary Bow, MD  oxyCODONE (OXY IR/ROXICODONE) 5 MG immediate release tablet Take 1 tablet (5 mg total) by mouth every 6 (six) hours as needed for severe pain. 12/15/16   Sindy Guadeloupe, MD  ranitidine (ZANTAC) 150 MG tablet Take 150 mg by mouth 2 (two) times daily as needed (for gerd/ulcers.).  10/21/16   [provider]  sertraline (ZOLOFT) 100 MG tablet Take 100 mg by mouth daily. 10/21/16   [provider]  tiZANidine (ZANAFLEX) 4 MG tablet Take 4 mg by mouth 3 (three) times daily. 10/21/16   [provider]  VENTOLIN HFA 108 (90 Base) MCG/ACT inhaler Inhale 2 puffs into the lungs every 6 (six) hours as needed for wheezing or shortness of breath.  07/08/16   [provider]    Allergies  Allergen Reactions  . Penicillins     Childhood reaction unknown reaction  .Has patient had a PCN reaction causing immediate rash, facial/tongue/throat swelling, SOB or lightheadedness with hypotension: Unknown Has patient had a PCN reaction causing severe rash involving mucus membranes or skin necrosis: Unknown Has patient had a PCN reaction that required hospitalization: Unknown Has patient had a PCN reaction occurring within the last 10 years: Unknown If all of the above answers are "NO", then may proceed with Cephalosporin use.     Family History  Problem Relation Age of Onset  . Dementia Mother   . Parkinson's disease Father   . Cancer Paternal Aunt   . Cancer Paternal Grandmother     Social History Social History  Substance Use Topics  . Smoking status: Current Every Day Smoker    Packs/day: 1.00    Years: 50.00    Types: Cigarettes  . Smokeless tobacco: Never Used  . Alcohol use Yes     Comment: Socially     Review of Systems  Constitutional: Negative for fever. Eyes: Negative for visual changes. ENT: Negative for sore  throat. Cardiovascular: Negative for chest pain. Respiratory: Chronic shortness of breath. Gastrointestinal: Negative for abdominal pain, vomiting and diarrhea. Genitourinary: Negative for dysuria. Musculoskeletal: Negative for back pain. Skin: Negative for rash. Neurological: Negative for headache.  ____________________________________________   PHYSICAL EXAM:  VITAL SIGNS: ED Triage Vitals  Enc Vitals Group     BP 12/16/16 1650 106/63     Pulse Rate 12/16/16 1650 78     Resp 12/16/16 1650 16     Temp 12/16/16 1650 98 F (36.7 C)     Temp Source 12/16/16 1650 Oral     SpO2 12/16/16 1645 97 %     Weight 12/16/16 1647 154 lb (69.9 kg)     Height 12/16/16 1647 5\' 4"  (1.626 m)     Head Circumference --  Peak Flow --      Pain Score 12/16/16 1645 5     Pain Loc --      Pain Edu? --      Excl. in [place]? --      Constitutional: Alert and but poor historian. Well appearing and in no distress. HEENT   Head: Normocephalic, he does have multiple redness and bruising areas across his face and top of the scalp.      Eyes: Conjunctivae are normal. Pupils equal and round.       Ears:         Nose: No congestion/rhinnorhea.   Mouth/Throat: Mucous membranes are moist.   Neck: No stridor.  No posterior midline C-spine tenderness per se, but patient reports pain "all over.  " Cardiovascular/Chest: Irregularly irregular and tachycardic.  No murmurs, rubs, or gallops. Respiratory: Normal respiratory effort without tachypnea nor retractions. Breath sounds are clear and equal bilaterally. No wheezes/rales/rhonchi. Gastrointestinal: Soft. No distention, no guarding, no rebound. Nontender.    Genitourinary/rectal:Deferred Musculoskeletal: Nontender with normal range of motion in all extremities. No joint effusions.  No lower extremity tenderness.  No edema.  He does have some bruising to the top of the left shoulder which is essentially nontender with normal range of motion.  He has  redness and bruising to both knees with full range of motion, no bony deformities.  Neurovascularly intact. Neurologic: No slurred speech or facial droop.  Poor historian but overall cooperative and answering questions fairly properly.  No gross or focal neurologic deficits are appreciated. Skin:  Skin is warm, dry and intact. No rash noted. Psychiatric: Mood and affect are normal. Speech and behavior are normal. Patient exhibits appropriate insight and judgment.   ____________________________________________  LABS (pertinent positives/negatives) I, Lisa Roca, MD the attending physician have reviewed the labs noted below.  Labs Reviewed  COMPREHENSIVE METABOLIC PANEL - Abnormal; Notable for the following:       Result Value   Sodium 134 (*)    Chloride 92 (*)    Glucose, Bld 103 (*)    Albumin 2.6 (*)    AST 51 (*)    All other components within normal limits  TROPONIN I - Abnormal; Notable for the following:    Troponin I 0.04 (*)    All other components within normal limits  CBC WITH DIFFERENTIAL/PLATELET - Abnormal; Notable for the following:    WBC 11.6 (*)    Hemoglobin 12.6 (*)    HCT 38.9 (*)    Neutro Abs 10.2 (*)    Lymphs Abs 0.6 (*)    All other components within normal limits  CK - Abnormal; Notable for the following:    Total CK 1,068 (*)    All other components within normal limits  LACTIC ACID, PLASMA  URINALYSIS, COMPLETE (UACMP) WITH MICROSCOPIC    ____________________________________________    EKG I, Lisa Roca, MD, the attending physician have personally viewed and interpreted all ECGs.  113 bpm.  Sinus tachycardia with PACs, questionable PVC.  Nonspecific ST and T wave. ____________________________________________  RADIOLOGY All Xrays were viewed by me.  Imaging interpreted by Radiologist, and I, Lisa Roca, MD the attending physician have reviewed the radiologist interpretation noted below.  CT head and cervical spine without contrast:   IMPRESSION: 1. Known brain metastases. An anterior right frontal metastasis has hemorrhaged since brain MRI 2 days ago. This hematoma measures up to 3 cm. 2. Negative for cervical spine fracture.  Chest x-ray two-view:  IMPRESSION: Complete collapse  and opacification of the left lower lobe as seen on PET-CT 2 days prior. __________________________________________  PROCEDURES  Procedure(s) performed: None  Critical Care performed: None  ____________________________________________  No current facility-administered medications on file prior to encounter.    Current Outpatient Prescriptions on File Prior to Encounter  Medication Sig Dispense Refill  . ADVAIR DISKUS 250-50 MCG/DOSE AEPB Inhale 1 puff into the lungs every 12 (twelve) hours.    Marland Kitchen aspirin EC 81 MG tablet Take 81 mg by mouth daily.    . bisacodyl ([employer] GENTLE LAXATIVE) 5 MG EC tablet Take 5-10 mg by mouth daily as needed for moderate constipation (FOR CONSTIPATION).    Marland Kitchen dexamethasone (DECADRON) 4 MG tablet Take 1 tablet (4 mg total) by mouth daily. 30 tablet 0  . doxazosin (CARDURA) 4 MG tablet Take 4 mg by mouth daily.    . finasteride (PROSCAR) 5 MG tablet Take 5 mg by mouth daily.    Marland Kitchen gabapentin (NEURONTIN) 300 MG capsule Take 300 mg by mouth 3 (three) times daily.    Marland Kitchen ipratropium-albuterol (DUONEB) 0.5-2.5 (3) MG/3ML SOLN Take 3 mLs by nebulization every 6 (six) hours as needed.    Marland Kitchen LORazepam (ATIVAN) 0.5 MG tablet Take 0.5 mg by mouth 2 (two) times daily.    . nicotine (NICODERM CQ - DOSED IN MG/24 HOURS) 14 mg/24hr patch Place 1 patch (14 mg total) onto the skin daily. (Patient not taking: Reported on 12/07/2016) 30 patch 0  . oxyCODONE (OXY IR/ROXICODONE) 5 MG immediate release tablet Take 1 tablet (5 mg total) by mouth every 6 (six) hours as needed for severe pain. 60 tablet 0  . ranitidine (ZANTAC) 150 MG tablet Take 150 mg by mouth 2 (two) times daily as needed (for gerd/ulcers.).     Marland Kitchen sertraline (ZOLOFT) 100  MG tablet Take 100 mg by mouth daily.    Marland Kitchen tiZANidine (ZANAFLEX) 4 MG tablet Take 4 mg by mouth 3 (three) times daily.    . VENTOLIN HFA 108 (90 Base) MCG/ACT inhaler Inhale 2 puffs into the lungs every 6 (six) hours as needed for wheezing or shortness of breath.       ____________________________________________  ED COURSE / ASSESSMENT AND PLAN  Pertinent labs & imaging results that were available during my care of the patient were reviewed by me and considered in my medical decision making (see chart for details).   Family member provides most of the baseline history that the patient was recently as of yesterday diagnosed with metastatic lung cancer to the brain.  It is unclear to me, without the patient is having any confusion to baseline, he cannot say exactly when he fell but states it was because he got tripped him and his oxygen tubing apparently that the story he gave to his family member as well.  He is on a blood thinner, aspirin, will CT the head especially given some bruising to the head.  He has some multiple bruising on the extremities without bony point tenderness and I am not suspicious of underlying fractures there.  Given how long he was on the floor, patient is going to have laboratory studies.  Patient states if everything comes back reassuring overall he would like to go home, I did ask family member if she thought it was safe at home and she felt a little uncomfortable with that.  He does have home health that comes in once per day and they were thinking of getting palliative care to come into the home.  Called by radiologist for brain metastasis frontal bleed.  No shift.  I discussed with patient and family member.  He would not be an operative candidate, he will be admitted here for further management of also rhabdomyolysis with.  I will go ahead and place him on dose of Keppra as well as dexamethasone.   DIFFERENTIAL DIAGNOSIS: Including but not limited to urinary  tract infection, dehydration, electrolyte abnormality, ACS, intracranial traumatic injury, C-spine injury, ecchymosis, rhabdomyolysis, etc.  CONSULTATIONS:   Hospitalist for admission.   Patient / Family / Caregiver informed of clinical course, medical decision-making process, and agree with plan.   ___________________________________________   FINAL CLINICAL IMPRESSION(S) / ED DIAGNOSES   Final diagnoses:  Multiple bruises  Fall in home, initial encounter  Brain metastasis ([place])  Intracranial bleed ([place])  Traumatic rhabdomyolysis, initial encounter [facility])              Note: This dictation was prepared with Dragon dictation. Any transcriptional errors that result from this process are unintentional    Lisa Roca, MD 12/16/16 2001

## 2016-12-16 NOTE — ED Notes (Signed)
Pt transport to 109

## 2016-12-16 NOTE — ED Triage Notes (Signed)
Pt arrived via ems for c/o fall last pm - ems was called today for well check and pt was found in floor - he reports that last pm he was walking an d tripped on the O2 tubing and fell in the floor - he was unable to get up so he has been in the floor since last pm - pt c/o bilat knee pain and lower back pain - back pain is a chronic issue

## 2016-12-16 NOTE — ED Notes (Addendum)
Pt noted to have bruising to bilat hands and bilat knees - he also has bruising to bridge of nose - pt states he hit his face when he fell but denies loss of consciousness - pt seems a little confused (keeps looking at tubing and cords and attempting to remove monitoring equipment) but is A&O x3 - pt does have a history of brain cancer

## 2016-12-16 NOTE — ED Notes (Signed)
The EKG was completed and signed by Dr. Reita Cliche. The EKG was also exported into the system.

## 2016-12-16 NOTE — ED Notes (Signed)
Pt given coke to drinnk - he remains oriented x3 but confused - he continues to pull at cords and tubing and picking at his skin

## 2016-12-16 NOTE — ED Notes (Signed)
Dr Marcille Blanco informed that pt right eye is staying closed while left eye is open - the right eye will open if the pt makes a conscious effort to open it

## 2016-12-16 NOTE — ED Notes (Signed)
Patient transported to CT 

## 2016-12-16 NOTE — ED Notes (Addendum)
Elevated troponin reported to Dr Reita Cliche - no new orders at this time Also pt right eye seems to be staying closed with left eye open - when asked pt to open both eyes he can make a conscious effort and opens both eyes, otherwise the left eye is staying closed - pupils are sluggish but equally reactive to light - Dr Reita Cliche aware

## 2016-12-16 NOTE — ED Notes (Signed)
Pharmacy emailed to send [place]

## 2016-12-17 NOTE — Progress Notes (Signed)
[facility] NAME: Bradley Frederick    MR#:  [id number]  DATE OF BIRTH:  1950/09/26  SUBJECTIVE:  Came in with increasing weakness and fall at home Very weak  REVIEW OF SYSTEMS:   Review of Systems  Constitutional: Negative for chills, fever and weight loss.  HENT: Negative for ear discharge, ear pain and nosebleeds.   Eyes: Negative for blurred vision, pain and discharge.  Respiratory: Negative for sputum production, shortness of breath, wheezing and stridor.   Cardiovascular: Negative for chest pain, palpitations, orthopnea and PND.  Gastrointestinal: Negative for abdominal pain, diarrhea, nausea and vomiting.  Genitourinary: Negative for frequency and urgency.  Musculoskeletal: Negative for back pain and joint pain.  Neurological: Positive for weakness. Negative for sensory change, speech change and focal weakness.  Psychiatric/Behavioral: Negative for depression and hallucinations. The patient is not nervous/anxious.    Tolerating Diet:some Tolerating PT: pending  DRUG ALLERGIES:   Allergies  Allergen Reactions  . Penicillins     Childhood reaction unknown reaction  .Has patient had a PCN reaction causing immediate rash, facial/tongue/throat swelling, SOB or lightheadedness with hypotension: Unknown Has patient had a PCN reaction causing severe rash involving mucus membranes or skin necrosis: Unknown Has patient had a PCN reaction that required hospitalization: Unknown Has patient had a PCN reaction occurring within the last 10 years: Unknown If all of the above answers are "NO", then may proceed with Cephalosporin use.     VITALS:  Blood pressure (!) 108/48, pulse 92, temperature 97.6 F (36.4 C), temperature source Oral, resp. rate 20, height 5\' 4"  (1.626 m), weight 70.3 kg (155 lb), SpO2 95 %.  PHYSICAL EXAMINATION:   Physical Exam  GENERAL:  66 y.o.-year-old patient lying in the bed with no acute distress.  Chronically ill EYES: Pupils equal, round, reactive to light and accommodation. No scleral icterus. Extraocular muscles intact.  HEENT: Head atraumatic, normocephalic. Oropharynx and nasopharynx clear.  NECK:  Supple, no jugular venous distention. No thyroid enlargement, no tenderness.  LUNGS: Normal breath sounds bilaterally, no wheezing, rales, rhonchi. No use of accessory muscles of respiration.  CARDIOVASCULAR: S1, S2 normal. No murmurs, rubs, or gallops.  ABDOMEN: Soft, nontender, nondistended. Bowel sounds present. No organomegaly or mass.  EXTREMITIES: No cyanosis, clubbing or edema b/l.    NEUROLOGIC: Cranial nerves II through XII are intact. No focal Motor or sensory deficits b/l.  weak PSYCHIATRIC:  patient is alert and oriented x 3.  SKIN: No obvious rash, lesion, or ulcer. Bruise on the shoulder, forehead  LABORATORY PANEL:  CBC  Recent Labs Lab 12/16/16 1702  WBC 11.6*  HGB 12.6*  HCT 38.9*  PLT 397    Chemistries   Recent Labs Lab 12/16/16 1702  NA 134*  K 3.5  CL 92*  CO2 31  GLUCOSE 103*  BUN 13  CREATININE 0.71  CALCIUM 9.2  AST 51*  ALT 22  ALKPHOS 90  BILITOT 1.0   Cardiac Enzymes  Recent Labs Lab 12/16/16 1702  TROPONINI 0.04*   RADIOLOGY:  Dg Chest 2 View  Result Date: 12/16/2016 CLINICAL DATA:  Lung cancer.  Cough. EXAM: CHEST  2 VIEW COMPARISON:  10/03/2016 FINDINGS: Complete collapse and opacification of the left lower lobe. This is known from PET-CT 2 days prior. Background COPD with hyperinflation. Porta catheter on the right with tip at the upper cavoatrial junction. No pneumothorax or pulmonary edema. IMPRESSION: Complete collapse and opacification of the left lower lobe as seen  on PET-CT 2 days prior. Electronically Signed   By: Monte Fantasia M.D.   On: 12/16/2016 18:12   Ct Head Wo Contrast  Result Date: 12/16/2016 CLINICAL DATA:  Fall last night.  Brain metastases. EXAM: CT HEAD WITHOUT CONTRAST CT CERVICAL SPINE WITHOUT  CONTRAST TECHNIQUE: Multidetector CT imaging of the head and cervical spine was performed following the standard protocol without intravenous contrast. Multiplanar CT image reconstructions of the cervical spine were also generated. COMPARISON:  Brain MRI from 2 days prior FINDINGS: CT HEAD FINDINGS Brain: There are known multiple high-density brain metastases. By CT 2 are seen in the right cerebellum, 3 are seen in the left frontal lobe extending from the cortex to the ependyma, 1 is seen at the left caudal thalamic groove, and 1 is seen in the superficial anterior right frontal lobe. This latter metastasis is notable for interval hemorrhage with hematoma measuring 2.8 x 2.2 x 2.3 cm. Mild rim of edema. No significant mass effect. No hydrocephalus or infarct. Vascular: Atherosclerotic calcification.  No hyperdense vessel. Skull: No acute finding. Sinuses/Orbits: No evidence of injury Other: Critical Value/emergent results were called by telephone at the time of interpretation on 12/16/2016 at 6:23 pm to Dr. Lisa Roca , who verbally acknowledged these results. CT CERVICAL SPINE FINDINGS Alignment: No traumatic malalignment. Skull base and vertebrae: Negative for fracture. Soft tissues and spinal canal: No prevertebral fluid or swelling. No visible canal hematoma. Disc levels:  Diffuse disc degeneration. Upper chest: No acute finding.  Apical emphysema. IMPRESSION: 1. Known brain metastases. An anterior right frontal metastasis has hemorrhaged since brain MRI 2 days ago. This hematoma measures up to 3 cm. 2. Negative for cervical spine fracture. Electronically Signed   By: Monte Fantasia M.D.   On: 12/16/2016 18:27   Ct Cervical Spine Wo Contrast  Result Date: 12/16/2016 CLINICAL DATA:  Fall last night.  Brain metastases. EXAM: CT HEAD WITHOUT CONTRAST CT CERVICAL SPINE WITHOUT CONTRAST TECHNIQUE: Multidetector CT imaging of the head and cervical spine was performed following the standard protocol without  intravenous contrast. Multiplanar CT image reconstructions of the cervical spine were also generated. COMPARISON:  Brain MRI from 2 days prior FINDINGS: CT HEAD FINDINGS Brain: There are known multiple high-density brain metastases. By CT 2 are seen in the right cerebellum, 3 are seen in the left frontal lobe extending from the cortex to the ependyma, 1 is seen at the left caudal thalamic groove, and 1 is seen in the superficial anterior right frontal lobe. This latter metastasis is notable for interval hemorrhage with hematoma measuring 2.8 x 2.2 x 2.3 cm. Mild rim of edema. No significant mass effect. No hydrocephalus or infarct. Vascular: Atherosclerotic calcification.  No hyperdense vessel. Skull: No acute finding. Sinuses/Orbits: No evidence of injury Other: Critical Value/emergent results were called by telephone at the time of interpretation on 12/16/2016 at 6:23 pm to Dr. Lisa Roca , who verbally acknowledged these results. CT CERVICAL SPINE FINDINGS Alignment: No traumatic malalignment. Skull base and vertebrae: Negative for fracture. Soft tissues and spinal canal: No prevertebral fluid or swelling. No visible canal hematoma. Disc levels:  Diffuse disc degeneration. Upper chest: No acute finding.  Apical emphysema. IMPRESSION: 1. Known brain metastases. An anterior right frontal metastasis has hemorrhaged since brain MRI 2 days ago. This hematoma measures up to 3 cm. 2. Negative for cervical spine fracture. Electronically Signed   By: Monte Fantasia M.D.   On: 12/16/2016 18:27   Ir Fluoro Guide Port Insertion Right  Result  Date: 12/15/2016 CLINICAL DATA:  Non-small-cell lung carcinoma of left perihilar region with metastatic disease to multiple sites. Port-A-Cath needed for chemotherapy. EXAM: IMPLANTED PORT A CATH PLACEMENT WITH ULTRASOUND AND FLUOROSCOPIC GUIDANCE ANESTHESIA/SEDATION: 2.0 mg IV Versed; 100 mcg IV Fentanyl Total Moderate Sedation Time:  30 minutes The patient's level of  consciousness and physiologic status were continuously monitored during the procedure by Radiology nursing. Additional Medications: 1 g IV vancomycin. FLUOROSCOPY TIME:  24 seconds.  3.0 mGy. PROCEDURE: The procedure, risks, benefits, and alternatives were explained to the patient. Questions regarding the procedure were encouraged and answered. The patient understands and consents to the procedure. A time-out was performed prior to initiating the procedure. Ultrasound was utilized to confirm patency of the right internal jugular vein. The right neck and chest were prepped with chlorhexidine in a sterile fashion, and a sterile drape was applied covering the operative field. Maximum barrier sterile technique with sterile gowns and gloves were used for the procedure. Local anesthesia was provided with 1% lidocaine. After creating a small venotomy incision, a 21 gauge needle was advanced into the right internal jugular vein under direct, real-time ultrasound guidance. Ultrasound image documentation was performed. After securing guidewire access, an 8 Fr dilator was placed. A J-wire was kinked to measure appropriate catheter length. A subcutaneous port pocket was then created along the upper chest wall utilizing sharp and blunt dissection. Portable cautery was utilized. The pocket was irrigated with sterile saline. A single lumen power injectable port was chosen for placement. The 8 Fr catheter was tunneled from the port pocket site to the venotomy incision. The port was placed in the pocket. External catheter was trimmed to appropriate length based on guidewire measurement. At the venotomy, an 8 Fr peel-away sheath was placed over a guidewire. The catheter was then placed through the sheath and the sheath removed. Final catheter positioning was confirmed and documented with a fluoroscopic spot image. The port was accessed with a needle and aspirated and flushed with heparinized saline. The access needle was removed. The  venotomy and port pocket incisions were closed with subcutaneous 3-0 Monocryl and subcuticular 4-0 Vicryl. Dermabond was applied to both incisions. COMPLICATIONS: COMPLICATIONS None FINDINGS: After catheter placement, the tip lies at the cavo-atrial junction. The catheter aspirates normally and is ready for immediate use. IMPRESSION: Placement of single lumen port a cath via right internal jugular vein. The catheter tip lies at the cavo-atrial junction. A power injectable port a cath was placed and is ready for immediate use. Electronically Signed   By: Aletta Edouard M.D.   On: 12/15/2016 15:41   ASSESSMENT AND PLAN:   66 year old male admitted for rhabdomyolysis. 1. Acute Rhabdo: Elevated CK. Continue to follow. Hydrate with intravenous fluid.  2. Lung cancer: With metastases to brain. The patient's been given dexamethasone which will continue orally as a palliative measure. He already has ptosis of the right eye although this is due to neglect as the patient can be prompted to open the eye. No midline shift. He is also very confused. Consult palliative care.  3. BPH: Continue finasteride and doxazosin  4. Fall: Morphine as needed for severe pain  5. Left lower lobe collapse: Secondary to lung cancer; the patient is comfortable and not tachypneic. Oxygen saturations normal on room air.  6. DVT prophylaxis: SCDs  7. GI prophylaxis: Pepcid per home regimen  The patient is a DO NOT RESUSCITATE.    Case discussed with Care Management/Social Worker. Management plans discussed with the patient, family  and they are in agreement.  CODE STATUS: dnr  DVT Prophylaxis: SCD  TOTAL TIME TAKING CARE OF THIS PATIENT: *30* minutes.  >50% time spent on counselling and coordination of care  POSSIBLE D/C IN *1-2* DAYS, DEPENDING ON CLINICAL CONDITION.  Note: This dictation was prepared with Dragon dictation along with smaller phrase technology. Any transcriptional errors that result from this  process are unintentional.  Annisha Baar M.D on 12/17/2016 at 12:16 PM  Between 7am to 6pm - Pager - [phone]  After 6pm go to [email or web address] - password [email or web address] [facility]  Office  [phone]  CC: Primary care physician; Marygrace Drought, MD

## 2016-12-17 NOTE — Progress Notes (Signed)
Physical Therapy Evaluation Patient Details Name: Bradley Frederick MRN: [id number] DOB: [birth year] Today's Date: 12/17/2016   History of Present Illness  Patient is a 66 y.o. male admitted after fall on 17 Dec 2016. Patient diagnosed with lung CA that metastasized to brain with hemorrhage on CT scan.   Clinical Impression  Patient admitted for above listed reasons. Patient demonstrates difficulty speaking and answering questions intermittently, sometimes requiring prompting to follow commands in various ways. Patient demonstrates good strength and ability to complete bed mobility. His vitals remained WNL on 4L O2. He moves from sit to stand impulsively and tends to lose balance posteriorly. Deferred gait assessment due to inability to consistently follow commands in standing with poor dynamic balance. Patient will continue to benefit from skilled and progressive PT to prevent falls in the future.     Follow Up Recommendations SNF    Equipment Recommendations  None recommended by PT    Recommendations for Other Services       Precautions / Restrictions Precautions Precautions: Fall Restrictions Weight Bearing Restrictions: No      Mobility  Bed Mobility Overal bed mobility: Needs Assistance Bed Mobility: Supine to Sit;Sit to Supine     Supine to sit: Min guard;HOB elevated Sit to supine: Min assist   General bed mobility comments: Patient performs bed mobility with CGA and verbal cues.   Transfers Overall transfer level: Needs assistance Equipment used: Rolling walker (2 wheeled) Transfers: Sit to/from Stand Sit to Stand: Min assist         General transfer comment: Patient performs sit to stand and stand to sit impulsively. Demonstrates unsteadiness with posterior LOB.  Ambulation/Gait             General Gait Details: Not performed  Stairs            Wheelchair Mobility    Modified Rankin (Stroke Patients Only)       Balance Overall balance  assessment: Needs assistance;History of Falls Sitting-balance support: Feet supported Sitting balance-Leahy Scale: Good     Standing balance support: Bilateral upper extremity supported Standing balance-Leahy Scale: Poor                               Pertinent Vitals/Pain Pain Assessment: No/denies pain    Home Living Family/patient expects to be discharged to:: Hospice/Palliative care Living Arrangements: Alone             Home Equipment: Cane - single point;Grab bars - toilet;Wheelchair - power      Prior Function Level of Independence: Needs assistance   Gait / Transfers Assistance Needed: Pt mostly just performs transfers to/from power wheelchair secondary to chronic back pain. Reports very limited ambulation in the last few years and at most he says that he will take a few steps with his cane  ADL's / Homemaking Assistance Needed: Independent with ADLs, daughter assists patient with IADLs        Hand Dominance        Extremity/Trunk Assessment   Upper Extremity Assessment Upper Extremity Assessment: Generalized weakness    Lower Extremity Assessment Lower Extremity Assessment: Generalized weakness       Communication   Communication: No difficulties  Cognition Arousal/Alertness: Awake/alert Behavior During Therapy: WFL for tasks assessed/performed;Impulsive Overall Cognitive Status: Difficult to assess  General Comments: Patient oriented x3 but occasionally exhibited difficulty answering questions/performed impulsive behaviors      General Comments      Exercises     Assessment/Plan    PT Assessment Patient needs continued PT services  PT Problem List Decreased strength;Decreased activity tolerance;Decreased balance;Decreased mobility;Decreased safety awareness;Decreased cognition       PT Treatment Interventions DME instruction;Gait training;Functional mobility training;Therapeutic  activities;Therapeutic exercise;Balance training;Cognitive remediation;Patient/family education    PT Goals (Current goals can be found in the Care Plan section)  Acute Rehab PT Goals Patient Stated Goal: Unstated PT Goal Formulation: With patient Time For Goal Achievement: 12/31/16 Potential to Achieve Goals: Fair    Frequency Min 2X/week   Barriers to discharge Inaccessible home environment;Decreased caregiver support      Co-evaluation               AM-PAC PT "6 Clicks" Daily Activity  Outcome Measure Difficulty turning over in bed (including adjusting bedclothes, sheets and blankets)?: Unable Difficulty moving from lying on back to sitting on the side of the bed? : Unable Difficulty sitting down on and standing up from a chair with arms (e.g., wheelchair, bedside commode, etc,.)?: Unable Help needed moving to and from a bed to chair (including a wheelchair)?: A Lot Help needed walking in hospital room?: A Lot Help needed climbing 3-5 steps with a railing? : Total 6 Click Score: 8    End of Session Equipment Utilized During Treatment: Gait belt;Oxygen Activity Tolerance: Patient tolerated treatment well Patient left: in bed;with call bell/phone within reach;with bed alarm set Nurse Communication: Mobility status PT Visit Diagnosis: Unsteadiness on feet (R26.81);Muscle weakness (generalized) (M62.81);History of falling (Z91.81);Difficulty in walking, not elsewhere classified (R26.2)    Time: 1205-1226 PT Time Calculation (min) (ACUTE ONLY): 21 min   Charges:   PT Evaluation $PT Eval Low Complexity: 1 Low     PT G Codes:   PT G-Codes **NOT FOR INPATIENT CLASS** Functional Assessment Tool Used: AM-PAC 6 Clicks Basic Mobility;Clinical judgement Functional Limitation: Mobility: Walking and moving around Mobility: Walking and Moving Around Current Status (K0938): At least 80 percent but less than 100 percent impaired, limited or restricted Mobility: Walking and  Moving Around Goal Status [phone]): At least 80 percent but less than 100 percent impaired, limited or restricted      Dorice Lamas, PT, DPT 12/17/2016, 12:52 PM

## 2016-12-17 NOTE — H&P (Signed)
Bradley Frederick is an 66 y.o. male.   Chief Complaint: Fall HPI: The patient with past medical history of lung cancer presents emergency department after a fall. The patient sustained bruises and minor lacerations but states that he does not hurt at the present. 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 CT scan of his head in the emergency department revealed hemorrhage around a frontal lobe lesion. The patient is very confused but is oriented 3. He is given dexamethasone in the emergency department prior to the hospitalist service being called for admission.  Past Medical History:  Diagnosis Date  . Anxiety   . Arthritis   . Brain cancer ([place])   . Carpal tunnel syndrome, bilateral   . COPD (chronic obstructive pulmonary disease) ([place])   . Depression   . Diverticulosis   . Dyspnea   . GERD (gastroesophageal reflux disease)   . Headache   . IBS (irritable bowel syndrome)   . Lung cancer ([place])   . Occasional tremors   . On home oxygen therapy    2 L/M vai nasal cannula  . Peripheral neuropathy   . Seizures ([place]) 6440   alcoholic seizure x 1  . Sleep apnea    NO C-PAP  . Vertigo    occ  . Vitamin B 12 deficiency     Past Surgical History:  Procedure Laterality Date  . BACK SURGERY     Lumbat Laminectomy  . BACK SURGERY     Spinal Fusion  . ENDOBRONCHIAL ULTRASOUND N/A 12/01/2016   Procedure: ENDOBRONCHIAL ULTRASOUND;  Surgeon: [name], MD;  Location: [facility] ORS;  Service: Cardiopulmonary;  Laterality: N/A;  . IR FLUORO GUIDE PORT INSERTION RIGHT  12/15/2016  . TONSILLECTOMY      Family History  Problem Relation Age of Onset  . Dementia Mother   . Parkinson's disease Father   . Cancer Paternal Aunt   . Cancer Paternal Grandmother    Social History:  reports that he has been smoking Cigarettes.  He has a 50.00 pack-year smoking history. He has never used smokeless tobacco. He  reports that he drinks alcohol. He reports that he does not use drugs.  Allergies:  Allergies  Allergen Reactions  . Penicillins     Childhood reaction unknown reaction  .Has patient had a PCN reaction causing immediate rash, facial/tongue/throat swelling, SOB or lightheadedness with hypotension: Unknown Has patient had a PCN reaction causing severe rash involving mucus membranes or skin necrosis: Unknown Has patient had a PCN reaction that required hospitalization: Unknown Has patient had a PCN reaction occurring within the last 10 years: Unknown If all of the above answers are "NO", then may proceed with Cephalosporin use.     Medications Prior to Admission  Medication Sig Dispense Refill  . albuterol (PROVENTIL HFA) 108 (90 Base) MCG/ACT inhaler Inhale 2 puffs into the lungs every 6 (six) hours as needed.    . bisacodyl ([employer] GENTLE LAXATIVE) 5 MG EC tablet Take 5-10 mg by mouth daily as needed for moderate constipation (FOR CONSTIPATION).    Marland Kitchen dexamethasone (DECADRON) 4 MG tablet Take 1 tablet (4 mg total) by mouth daily. 30 tablet 0  . doxazosin (CARDURA) 4 MG tablet Take 4 mg by mouth daily.    . DULoxetine (CYMBALTA) 60 MG capsule TAKE 1 CAPSULE BY MOUTH ONCE DAILY.    . finasteride (PROSCAR) 5 MG tablet Take 5 mg by mouth daily.    Marland Kitchen  Fluticasone-Salmeterol (ADVAIR DISKUS) 250-50 MCG/DOSE AEPB Inhale 1 puff into the lungs every 12 (twelve) hours.    . gabapentin (NEURONTIN) 300 MG capsule Take 300 mg by mouth 3 (three) times daily.    Marland Kitchen ipratropium-albuterol (DUONEB) 0.5-2.5 (3) MG/3ML SOLN Take 3 mLs by nebulization every 6 (six) hours as needed.    Marland Kitchen LORazepam (ATIVAN) 0.5 MG tablet Take 0.5 mg by mouth 2 (two) times daily.    Marland Kitchen oxyCODONE (OXY IR/ROXICODONE) 5 MG immediate release tablet Take 1 tablet (5 mg total) by mouth every 6 (six) hours as needed for severe pain. 60 tablet 0  . ranitidine (ZANTAC) 150 MG tablet Take 150 mg by mouth 2 (two) times daily as needed (for  gerd/ulcers.).     Marland Kitchen sertraline (ZOLOFT) 100 MG tablet Take 100 mg by mouth daily.    Marland Kitchen tiZANidine (ZANAFLEX) 4 MG tablet Take 4 mg by mouth 3 (three) times daily.    . nicotine (NICODERM CQ - DOSED IN MG/24 HOURS) 14 mg/24hr patch Place 1 patch (14 mg total) onto the skin daily. (Patient not taking: Reported on 12/07/2016) 30 patch 0    Results for orders placed or performed during the hospital encounter of 12/16/16 (from the past 48 hour(s))  Comprehensive metabolic panel     Status: Abnormal   Collection Time: 12/16/16  5:02 PM  Result Value Ref Range   Sodium 134 (L) 135 - 145 mmol/L   Potassium 3.5 3.5 - 5.1 mmol/L   Chloride 92 (L) 101 - 111 mmol/L   CO2 31 22 - 32 mmol/L   Glucose, Bld 103 (H) 65 - 99 mg/dL   BUN 13 6 - 20 mg/dL   Creatinine, Ser 0.71 0.61 - 1.24 mg/dL   Calcium 9.2 8.9 - 10.3 mg/dL   Total Protein 7.5 6.5 - 8.1 g/dL   Albumin 2.6 (L) 3.5 - 5.0 g/dL   AST 51 (H) 15 - 41 U/L   ALT 22 17 - 63 U/L   Alkaline Phosphatase 90 38 - 126 U/L   Total Bilirubin 1.0 0.3 - 1.2 mg/dL   GFR calc non Af Amer >60 >60 mL/min   GFR calc Af Amer >60 >60 mL/min    Comment: (NOTE) The eGFR has been calculated using the CKD EPI equation. This calculation has not been validated in all clinical situations. eGFR's persistently <60 mL/min signify possible Chronic Kidney Disease.    Anion gap 11 5 - 15  Troponin I     Status: Abnormal   Collection Time: 12/16/16  5:02 PM  Result Value Ref Range   Troponin I 0.04 (HH) <0.03 ng/mL    Comment: CRITICAL RESULT CALLED TO, READ BACK BY AND VERIFIED WITH TERESA CLAPP AT 1818 12/16/16.PMH  CBC with Differential     Status: Abnormal   Collection Time: 12/16/16  5:02 PM  Result Value Ref Range   WBC 11.6 (H) 3.8 - 10.6 K/uL   RBC 4.49 4.40 - 5.90 MIL/uL   Hemoglobin 12.6 (L) 13.0 - 18.0 g/dL   HCT 38.9 (L) 40.0 - 52.0 %   MCV 86.6 80.0 - 100.0 fL   MCH 28.0 26.0 - 34.0 pg   MCHC 32.3 32.0 - 36.0 g/dL   RDW 14.4 11.5 - 14.5 %    Platelets 397 150 - 440 K/uL   Neutrophils Relative % 87 %   Neutro Abs 10.2 (H) 1.4 - 6.5 K/uL   Lymphocytes Relative 5 %   Lymphs Abs 0.6 (L) 1.0 -  3.6 K/uL   Monocytes Relative 7 %   Monocytes Absolute 0.8 0.2 - 1.0 K/uL   Eosinophils Relative 0 %   Eosinophils Absolute 0.0 0 - 0.7 K/uL   Basophils Relative 1 %   Basophils Absolute 0.1 0 - 0.1 K/uL  CK     Status: Abnormal   Collection Time: 12/16/16  5:02 PM  Result Value Ref Range   Total CK 1,068 (H) 49 - 397 U/L  Lactic acid, plasma     Status: None   Collection Time: 12/16/16  6:19 PM  Result Value Ref Range   Lactic Acid, Venous 0.7 0.5 - 1.9 mmol/L   Dg Chest 2 View  Result Date: 12/16/2016 CLINICAL DATA:  Lung cancer.  Cough. EXAM: CHEST  2 VIEW COMPARISON:  10/03/2016 FINDINGS: Complete collapse and opacification of the left lower lobe. This is known from PET-CT 2 days prior. Background COPD with hyperinflation. Porta catheter on the right with tip at the upper cavoatrial junction. No pneumothorax or pulmonary edema. IMPRESSION: Complete collapse and opacification of the left lower lobe as seen on PET-CT 2 days prior. Electronically Signed   By: Monte Fantasia M.D.   On: 12/16/2016 18:12   Ct Head Wo Contrast  Result Date: 12/16/2016 CLINICAL DATA:  Fall last night.  Brain metastases. EXAM: CT HEAD WITHOUT CONTRAST CT CERVICAL SPINE WITHOUT CONTRAST TECHNIQUE: Multidetector CT imaging of the head and cervical spine was performed following the standard protocol without intravenous contrast. Multiplanar CT image reconstructions of the cervical spine were also generated. COMPARISON:  Brain MRI from 2 days prior FINDINGS: CT HEAD FINDINGS Brain: There are known multiple high-density brain metastases. By CT 2 are seen in the right cerebellum, 3 are seen in the left frontal lobe extending from the cortex to the ependyma, 1 is seen at the left caudal thalamic groove, and 1 is seen in the superficial anterior right frontal lobe. This  latter metastasis is notable for interval hemorrhage with hematoma measuring 2.8 x 2.2 x 2.3 cm. Mild rim of edema. No significant mass effect. No hydrocephalus or infarct. Vascular: Atherosclerotic calcification.  No hyperdense vessel. Skull: No acute finding. Sinuses/Orbits: No evidence of injury Other: Critical Value/emergent results were called by telephone at the time of interpretation on 12/16/2016 at 6:23 pm to Dr. Lisa Roca , who verbally acknowledged these results. CT CERVICAL SPINE FINDINGS Alignment: No traumatic malalignment. Skull base and vertebrae: Negative for fracture. Soft tissues and spinal canal: No prevertebral fluid or swelling. No visible canal hematoma. Disc levels:  Diffuse disc degeneration. Upper chest: No acute finding.  Apical emphysema. IMPRESSION: 1. Known brain metastases. An anterior right frontal metastasis has hemorrhaged since brain MRI 2 days ago. This hematoma measures up to 3 cm. 2. Negative for cervical spine fracture. Electronically Signed   By: Monte Fantasia M.D.   On: 12/16/2016 18:27   Ct Cervical Spine Wo Contrast  Result Date: 12/16/2016 CLINICAL DATA:  Fall last night.  Brain metastases. EXAM: CT HEAD WITHOUT CONTRAST CT CERVICAL SPINE WITHOUT CONTRAST TECHNIQUE: Multidetector CT imaging of the head and cervical spine was performed following the standard protocol without intravenous contrast. Multiplanar CT image reconstructions of the cervical spine were also generated. COMPARISON:  Brain MRI from 2 days prior FINDINGS: CT HEAD FINDINGS Brain: There are known multiple high-density brain metastases. By CT 2 are seen in the right cerebellum, 3 are seen in the left frontal lobe extending from the cortex to the ependyma, 1 is seen at the  left caudal thalamic groove, and 1 is seen in the superficial anterior right frontal lobe. This latter metastasis is notable for interval hemorrhage with hematoma measuring 2.8 x 2.2 x 2.3 cm. Mild rim of edema. No significant  mass effect. No hydrocephalus or infarct. Vascular: Atherosclerotic calcification.  No hyperdense vessel. Skull: No acute finding. Sinuses/Orbits: No evidence of injury Other: Critical Value/emergent results were called by telephone at the time of interpretation on 12/16/2016 at 6:23 pm to Dr. Lisa Roca , who verbally acknowledged these results. CT CERVICAL SPINE FINDINGS Alignment: No traumatic malalignment. Skull base and vertebrae: Negative for fracture. Soft tissues and spinal canal: No prevertebral fluid or swelling. No visible canal hematoma. Disc levels:  Diffuse disc degeneration. Upper chest: No acute finding.  Apical emphysema. IMPRESSION: 1. Known brain metastases. An anterior right frontal metastasis has hemorrhaged since brain MRI 2 days ago. This hematoma measures up to 3 cm. 2. Negative for cervical spine fracture. Electronically Signed   By: Monte Fantasia M.D.   On: 12/16/2016 18:27   Ir Fluoro Guide Port Insertion Right  Result Date: 12/15/2016 CLINICAL DATA:  Non-small-cell lung carcinoma of left perihilar region with metastatic disease to multiple sites. Port-A-Cath needed for chemotherapy. EXAM: IMPLANTED PORT A CATH PLACEMENT WITH ULTRASOUND AND FLUOROSCOPIC GUIDANCE ANESTHESIA/SEDATION: 2.0 mg IV Versed; 100 mcg IV Fentanyl Total Moderate Sedation Time:  30 minutes The patient's level of consciousness and physiologic status were continuously monitored during the procedure by Radiology nursing. Additional Medications: 1 g IV vancomycin. FLUOROSCOPY TIME:  24 seconds.  3.0 mGy. PROCEDURE: The procedure, risks, benefits, and alternatives were explained to the patient. Questions regarding the procedure were encouraged and answered. The patient understands and consents to the procedure. A time-out was performed prior to initiating the procedure. Ultrasound was utilized to confirm patency of the right internal jugular vein. The right neck and chest were prepped with chlorhexidine in a  sterile fashion, and a sterile drape was applied covering the operative field. Maximum barrier sterile technique with sterile gowns and gloves were used for the procedure. Local anesthesia was provided with 1% lidocaine. After creating a small venotomy incision, a 21 gauge needle was advanced into the right internal jugular vein under direct, real-time ultrasound guidance. Ultrasound image documentation was performed. After securing guidewire access, an 8 Fr dilator was placed. A J-wire was kinked to measure appropriate catheter length. A subcutaneous port pocket was then created along the upper chest wall utilizing sharp and blunt dissection. Portable cautery was utilized. The pocket was irrigated with sterile saline. A single lumen power injectable port was chosen for placement. The 8 Fr catheter was tunneled from the port pocket site to the venotomy incision. The port was placed in the pocket. External catheter was trimmed to appropriate length based on guidewire measurement. At the venotomy, an 8 Fr peel-away sheath was placed over a guidewire. The catheter was then placed through the sheath and the sheath removed. Final catheter positioning was confirmed and documented with a fluoroscopic spot image. The port was accessed with a needle and aspirated and flushed with heparinized saline. The access needle was removed. The venotomy and port pocket incisions were closed with subcutaneous 3-0 Monocryl and subcuticular 4-0 Vicryl. Dermabond was applied to both incisions. COMPLICATIONS: COMPLICATIONS None FINDINGS: After catheter placement, the tip lies at the cavo-atrial junction. The catheter aspirates normally and is ready for immediate use. IMPRESSION: Placement of single lumen port a cath via right internal jugular vein. The catheter tip lies at the  cavo-atrial junction. A power injectable port a cath was placed and is ready for immediate use. Electronically Signed   By: Aletta Edouard M.D.   On: 12/15/2016  15:41    Review of Systems  Unable to perform ROS: Medical condition    Blood pressure (!) 137/45, pulse (!) 101, temperature 98 F (36.7 C), temperature source Oral, resp. rate 18, height 5' 4"  (1.626 m), weight 69.9 kg (154 lb), SpO2 93 %. Physical Exam  Vitals reviewed. Constitutional: He is oriented to person, place, and time. He appears well-developed and well-nourished. No distress.  HENT:  Head: Normocephalic and atraumatic.  Mouth/Throat: Oropharynx is clear and moist.  Eyes: Pupils are equal, round, and reactive to light. Conjunctivae and EOM are normal. No scleral icterus.  Neck: Normal range of motion. Neck supple. No JVD present. No tracheal deviation present. No thyromegaly present.  Cardiovascular: Normal rate, regular rhythm and normal heart sounds.  Exam reveals no gallop and no friction rub.   No murmur heard. Respiratory: Effort normal. He has decreased breath sounds in the left lower field.  GI: Soft. Bowel sounds are normal. He exhibits no distension. There is no tenderness.  Genitourinary:  Genitourinary Comments: Deferred  Musculoskeletal: Normal range of motion. He exhibits no edema.  Lymphadenopathy:    He has no cervical adenopathy.  Neurological: He is alert and oriented to person, place, and time. A cranial nerve deficit (Ptosis (right side neglect)) is present.  Skin: Skin is warm and dry. No rash noted. No erythema.  Multiple bruises  Psychiatric: He has a normal mood and affect. His behavior is normal. Thought content normal.  Difficult to assess judgment as the patient is very confused     Assessment/Plan This is a 66 year old male admitted for rhabdomyolysis. 1. Rhabdo: Elevated CK. Continue to follow. Hydrate with intravenous fluid. 2. Lung cancer: With metastases to brain. The patient's been given dexamethasone which will continue orally as a palliative measure. He already has ptosis of the right eye although this is due to neglect as the patient  can be prompted to open the eye. No midline shift. He is also very confused. Consult palliative care. 3. BPH: Continue finasteride and doxazosin 4. Fall: Appears to be secondary to brain bleed. The patient is not agitated but he is still a fall risk. Monitor for safety. Morphine as needed for severe pain 5. Left lower lobe collapse: Secondary to lung cancer; the patient is comfortable and not tachypneic. Oxygen saturations normal on room air. 6. DVT prophylaxis: SCDs 7. GI prophylaxis: Pepcid per home regimen The patient is a DO NOT RESUSCITATE. Time spent on admission orders and patient care approximately 45 minutes.  Harrie Foreman, MD 12/17/2016, 12:42 AM

## 2016-12-17 NOTE — Plan of Care (Signed)
Problem: Pain Managment: Goal: General experience of comfort will improve Outcome: Progressing PRN IV Morphine given x 1 for c/o back pain with relief

## 2016-12-17 NOTE — Plan of Care (Signed)
Problem: Safety: Goal: Ability to remain free from injury will improve Outcome: Progressing Pt remains on High Fall Risks; on low bed with bed alarm on

## 2016-12-17 NOTE — Progress Notes (Addendum)
Occupational Therapy Treatment Patient Details Name: CANTRELL LAROUCHE MRN: [id number] DOB: January 23, 1951 Today's Date: 12/17/2016    History of present illness Pt. is a 66 y.o male who was admitted to [facility] with Rhabdomyolitis. Pt. PMHx includes: Lung CA, with mets to the brain that hemorrhaged.   OT comments  Pt. Is a 66 y.o. male who was was admitted to [facility] with Rhabdomyolitis. Pt. was admitted after tripping over his O2 line. Pt. has a history of Lung CA with Brain mets that hemorrhaged. Pt. presents with weakness, limited cognition, impulsivity, impaired safety awareness, pain, and limited activity tolerance. Pt. was independent with ADLs prior to admission, and required assist with meals. Pt. Used a power w/c. Pt. could benefit from skilled OT services for ADL training, A/E training, there. Ex, and pt. family education about energy conservation/work simplification techniques, cognitive compensatory strategies, home modification, and DME. Pt. Could benefit from follow-up OT services upon discharge to maximize independence with ADLs, and IADLs safely.    Follow Up Recommendations  SNF    Equipment Recommendations       Recommendations for Other Services      Precautions / Restrictions Precautions Precautions: Fall Restrictions Weight Bearing Restrictions: No              ADL either performed or assessed with clinical judgement   ADL Overall ADL's : Needs assistance/impaired Eating/Feeding: Set up;Minimal assistance   Grooming: Set up;Minimal assistance   Upper Body Bathing: Set up;Minimal assistance   Lower Body Bathing: Moderate assistance   Upper Body Dressing : Set up;Minimal assistance   Lower Body Dressing: Moderate assistance               Functional mobility during ADLs: Minimal assistance General ADL Comments: Pt. requires cues and assist for safety.     Vision  Continue to assess   Perception     Praxis      Cognition Arousal/Alertness:  Awake/alert Behavior During Therapy: WFL for tasks assessed/performed Overall Cognitive Status: Difficult to assess                                 General Comments: Alert and oriented. unable to recall the month.        Exercises     Shoulder Instructions       General Comments      Pertinent Vitals/ Pain       Pain Assessment: No/denies pain  Home Living Family/patient expects to be discharged to:: Hospice/Palliative care Living Arrangements: Alone                           Home Equipment: Cane - single point;Grab bars - toilet;Wheelchair - power          Prior Functioning/Environment Level of Independence: Needs assistance  Gait / Transfers Assistance Needed: Pt mostly just performs transfers to/from power wheelchair secondary to chronic back pain. Reports very limited ambulation in the last few years and at most he says that he will take a few steps with his cane ADL's / Homemaking Assistance Needed: Independent with ADLS, daughter assists pt. with IADLS. Stopped driving 6 months ago. Is on 2L Home o2       Frequency  Min 1X/week        Progress Toward Goals  OT Goals(current goals can now be found in the care plan section)     Acute Rehab  OT Goals Patient Stated Goal: Unable to state  Plan      Co-evaluation                 AM-PAC PT "6 Clicks" Daily Activity     Outcome Measure   Help from another person eating meals?: A Little Help from another person taking care of personal grooming?: A Little Help from another person toileting, which includes using toliet, bedpan, or urinal?: A Little Help from another person bathing (including washing, rinsing, drying)?: A Lot Help from another person to put on and taking off regular upper body clothing?: A Little Help from another person to put on and taking off regular lower body clothing?: A Lot 6 Click Score: 16    End of Session    OT Visit Diagnosis: Muscle weakness  (generalized) (M62.81)   Activity Tolerance Patient tolerated treatment well   Patient Left with call bell/phone within reach;in bed;with bed alarm set   Nurse Communication      Functional Limitation: Self care Self Care Current Status (V8550): At least 40 percent but less than 60 percent impaired, limited or restricted Self Care Goal Status (Z5868): At least 1 percent but less than 20 percent impaired, limited or restricted   Time: 1346-1401 OT Time Calculation (min): 15 min  Charges: OT G-codes **NOT FOR INPATIENT CLASS** Functional Limitation: Self care Self Care Current Status (Y5749): At least 40 percent but less than 60 percent impaired, limited or restricted Self Care Goal Status (T5521): At least 1 percent but less than 20 percent impaired, limited or restricted OT General Charges $OT Visit: 1 Visit OT Evaluation $OT Eval Low Complexity: 1 Low  Harrel Carina, MS, OTR/L  Harrel Carina, MS, OTR/L 12/17/2016, 2:55 PM

## 2016-12-18 LAB — CK: Total CK: 107 U/L (ref 49–397)

## 2016-12-18 MED ORDER — ENSURE ENLIVE PO LIQD
237.0000 mL | Freq: Three times a day (TID) | ORAL | Status: DC
  Administered 2016-12-18: 237 mL via ORAL

## 2016-12-18 NOTE — Progress Notes (Signed)
Initial Nutrition Assessment  DOCUMENTATION CODES:   Severe malnutrition in context of acute illness/injury  INTERVENTION:  Provide Ensure Enlive po TID, each supplement provides 350 kcal and 20 grams of protein.  Encouraged adequate intake of calories and protein at meals.  Will monitor patient's tolerance of diet and ability to chew. He is refusing downgraded diet at this time, but does not have his dentures here. Patient may benefit from help with setting up trays at meals.  NUTRITION DIAGNOSIS:   Severe Malnutrition related to acute illness (recent diagnosis of stage IV NSCLC with brain mets) as evidenced by moderate fat depletion, moderate muscle depletion.  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   Met with patient at bedside. He is known to this RD from previous admission. Patient is more confused today than on previous assessment and no family members present at bedside. Patient reports his appetite has been fair. He still attempts to eat 2-3 meals per day, but is eating smaller portions than he used to. His daughter prepares food for him or picks up food from restaurants. He enjoys pork and beans, potted meat, and ravioli. He reports he does not have his dentures here today. He does not want his food chopped, but would prefer to choose softer foods on  menu.  UBW 198 lbs. Per chart patient was 193 lbs on 10/01/2015. He was He was 166.3 lbs on 10/03/2016. As patient is on low bed, weights are rounded to nearest pound. Unsure of accuracy of 146 lbs from today, 155 lbs from 10/27 closer to weight RD obtained on bed scale today. He has lost 11.3 lbs (6.8% body weight) over 2 months, which is not significant for time frame.  Medications reviewed and include: Decadron 4 mg daily, Colace, famotidine, NS @ 75 mL/hr.  Labs reviewed: Sodium 134, Chloride 92.  NUTRITION - FOCUSED PHYSICAL EXAM:    Most Recent Value  Orbital Region  Moderate depletion  Upper Arm Region  Moderate depletion  Thoracic and Lumbar Region  Moderate depletion  Buccal Region  Moderate depletion  Temple Region  Moderate depletion  Clavicle Bone Region  Moderate depletion  Clavicle and Acromion Bone Region  Moderate depletion  Scapular Bone Region  Moderate depletion  Dorsal Hand  Moderate depletion  Patellar Region  Mild depletion  Anterior Thigh Region  Mild depletion  Posterior Calf Region  Mild depletion  Edema (RD Assessment)  None  Hair  Reviewed  Eyes  Reviewed  Mouth  Reviewed [edentulous]  Skin  Reviewed [bruising over arms and legs]  Nails  Reviewed       Diet Order:  Diet regular Room service appropriate? Yes; Fluid consistency: Thin  EDUCATION NEEDS:   Not appropriate for education at this time  Skin:  Skin Assessment: Skin Integrity Issues: Skin Integrity Issues:: Other (Comment) Other: skin tear left hand  Last BM:  12/16/2016  Height:  Ht Readings from Last 1 Encounters:  12/16/16 5' 4"  (1.626 m)    Weight:   Wt Readings from Last 1 Encounters:  12/18/16 146 lb (66.2 kg)    Ideal Body Weight:  59.1 kg  BMI:  Body mass index is 25.06 kg/m.  Estimated Nutritional Needs:   Kcal:  1805-2080 (MSJ x 1.3-1.5)  Protein:  91-105 grams (1.3-1.5 grams/kg)  Fluid:  2.1-2.5 L/day (30-35 mL/kg)  Willey Blade, MS, RD, LDN Office:  [phone] Pager: [phone] After Hours/Weekend Pager: [phone]

## 2016-12-18 NOTE — Progress Notes (Signed)
[facility] [place] NAME: Bradley Frederick    MR#:  [id number]  DATE OF BIRTH:  March 21, 1950  SUBJECTIVE:  Came in with increasing weakness and fall at home Very weak Pt denies any complaints  REVIEW OF SYSTEMS:   Review of Systems  Constitutional: Negative for chills, fever and weight loss.  HENT: Negative for ear discharge, ear pain and nosebleeds.   Eyes: Negative for blurred vision, pain and discharge.  Respiratory: Negative for sputum production, shortness of breath, wheezing and stridor.   Cardiovascular: Negative for chest pain, palpitations, orthopnea and PND.  Gastrointestinal: Negative for abdominal pain, diarrhea, nausea and vomiting.  Genitourinary: Negative for frequency and urgency.  Musculoskeletal: Negative for back pain and joint pain.  Neurological: Positive for weakness. Negative for sensory change, speech change and focal weakness.  Psychiatric/Behavioral: Negative for depression and hallucinations. The patient is not nervous/anxious.    Tolerating Diet:some Tolerating PT: Rehab  DRUG ALLERGIES:   Allergies  Allergen Reactions  . Penicillins     Childhood reaction unknown reaction  .Has patient had a PCN reaction causing immediate rash, facial/tongue/throat swelling, SOB or lightheadedness with hypotension: Unknown Has patient had a PCN reaction causing severe rash involving mucus membranes or skin necrosis: Unknown Has patient had a PCN reaction that required hospitalization: Unknown Has patient had a PCN reaction occurring within the last 10 years: Unknown If all of the above answers are "NO", then may proceed with Cephalosporin use.     VITALS:  Blood pressure (!) 111/52, pulse 77, temperature 98 F (36.7 C), resp. rate 20, height 5\' 4"  (1.626 m), weight 66.2 kg (146 lb), SpO2 98 %.  PHYSICAL EXAMINATION:   Physical Exam  GENERAL:  66 y.o.-year-old patient lying in the bed with no acute distress.  Chronically ill EYES: Pupils equal, round, reactive to light and accommodation. No scleral icterus. Extraocular muscles intact.  HEENT: Head atraumatic, normocephalic. Oropharynx and nasopharynx clear.  NECK:  Supple, no jugular venous distention. No thyroid enlargement, no tenderness.  LUNGS: Normal breath sounds bilaterally, no wheezing, rales, rhonchi. No use of accessory muscles of respiration.  CARDIOVASCULAR: S1, S2 normal. No murmurs, rubs, or gallops.  ABDOMEN: Soft, nontender, nondistended. Bowel sounds present. No organomegaly or mass.  EXTREMITIES: No cyanosis, clubbing or edema b/l.    NEUROLOGIC: Cranial nerves II through XII are intact. No focal Motor or sensory deficits b/l.  weak PSYCHIATRIC:  patient is alert and oriented x 3.  SKIN: No obvious rash, lesion, or ulcer. Bruise on the shoulder, forehead  LABORATORY PANEL:  CBC  Recent Labs Lab 12/16/16 1702  WBC 11.6*  HGB 12.6*  HCT 38.9*  PLT 397    Chemistries   Recent Labs Lab 12/16/16 1702  NA 134*  K 3.5  CL 92*  CO2 31  GLUCOSE 103*  BUN 13  CREATININE 0.71  CALCIUM 9.2  AST 51*  ALT 22  ALKPHOS 90  BILITOT 1.0   Cardiac Enzymes  Recent Labs Lab 12/16/16 1702  TROPONINI 0.04*   RADIOLOGY:  Dg Chest 2 View  Result Date: 12/16/2016 CLINICAL DATA:  Lung cancer.  Cough. EXAM: CHEST  2 VIEW COMPARISON:  10/03/2016 FINDINGS: Complete collapse and opacification of the left lower lobe. This is known from PET-CT 2 days prior. Background COPD with hyperinflation. Porta catheter on the right with tip at the upper cavoatrial junction. No pneumothorax or pulmonary edema. IMPRESSION: Complete collapse and opacification of the left lower lobe as  seen on PET-CT 2 days prior. Electronically Signed   By: Monte Fantasia M.D.   On: 12/16/2016 18:12   Ct Head Wo Contrast  Result Date: 12/16/2016 CLINICAL DATA:  Fall last night.  Brain metastases. EXAM: CT HEAD WITHOUT CONTRAST CT CERVICAL SPINE WITHOUT  CONTRAST TECHNIQUE: Multidetector CT imaging of the head and cervical spine was performed following the standard protocol without intravenous contrast. Multiplanar CT image reconstructions of the cervical spine were also generated. COMPARISON:  Brain MRI from 2 days prior FINDINGS: CT HEAD FINDINGS Brain: There are known multiple high-density brain metastases. By CT 2 are seen in the right cerebellum, 3 are seen in the left frontal lobe extending from the cortex to the ependyma, 1 is seen at the left caudal thalamic groove, and 1 is seen in the superficial anterior right frontal lobe. This latter metastasis is notable for interval hemorrhage with hematoma measuring 2.8 x 2.2 x 2.3 cm. Mild rim of edema. No significant mass effect. No hydrocephalus or infarct. Vascular: Atherosclerotic calcification.  No hyperdense vessel. Skull: No acute finding. Sinuses/Orbits: No evidence of injury Other: Critical Value/emergent results were called by telephone at the time of interpretation on 12/16/2016 at 6:23 pm to Dr. Lisa Roca , who verbally acknowledged these results. CT CERVICAL SPINE FINDINGS Alignment: No traumatic malalignment. Skull base and vertebrae: Negative for fracture. Soft tissues and spinal canal: No prevertebral fluid or swelling. No visible canal hematoma. Disc levels:  Diffuse disc degeneration. Upper chest: No acute finding.  Apical emphysema. IMPRESSION: 1. Known brain metastases. An anterior right frontal metastasis has hemorrhaged since brain MRI 2 days ago. This hematoma measures up to 3 cm. 2. Negative for cervical spine fracture. Electronically Signed   By: Monte Fantasia M.D.   On: 12/16/2016 18:27   Ct Cervical Spine Wo Contrast  Result Date: 12/16/2016 CLINICAL DATA:  Fall last night.  Brain metastases. EXAM: CT HEAD WITHOUT CONTRAST CT CERVICAL SPINE WITHOUT CONTRAST TECHNIQUE: Multidetector CT imaging of the head and cervical spine was performed following the standard protocol without  intravenous contrast. Multiplanar CT image reconstructions of the cervical spine were also generated. COMPARISON:  Brain MRI from 2 days prior FINDINGS: CT HEAD FINDINGS Brain: There are known multiple high-density brain metastases. By CT 2 are seen in the right cerebellum, 3 are seen in the left frontal lobe extending from the cortex to the ependyma, 1 is seen at the left caudal thalamic groove, and 1 is seen in the superficial anterior right frontal lobe. This latter metastasis is notable for interval hemorrhage with hematoma measuring 2.8 x 2.2 x 2.3 cm. Mild rim of edema. No significant mass effect. No hydrocephalus or infarct. Vascular: Atherosclerotic calcification.  No hyperdense vessel. Skull: No acute finding. Sinuses/Orbits: No evidence of injury Other: Critical Value/emergent results were called by telephone at the time of interpretation on 12/16/2016 at 6:23 pm to Dr. Lisa Roca , who verbally acknowledged these results. CT CERVICAL SPINE FINDINGS Alignment: No traumatic malalignment. Skull base and vertebrae: Negative for fracture. Soft tissues and spinal canal: No prevertebral fluid or swelling. No visible canal hematoma. Disc levels:  Diffuse disc degeneration. Upper chest: No acute finding.  Apical emphysema. IMPRESSION: 1. Known brain metastases. An anterior right frontal metastasis has hemorrhaged since brain MRI 2 days ago. This hematoma measures up to 3 cm. 2. Negative for cervical spine fracture. Electronically Signed   By: Monte Fantasia M.D.   On: 12/16/2016 18:27   ASSESSMENT AND PLAN:   66 year old male  admitted for rhabdomyolysis. 1. Acute Rhabdo: Elevated CK. Continue to follow. Hydrate with intravenous fluid. Check CK today. Appears well hydrated  2. Lung cancer: With metastases to brain. The patient's been given dexamethasone which will continue orally as a palliative measure. - Consult palliative care. -pt has appt to get brain mapping tomorrow for Radiation.  3. BPH:  Continue finasteride and doxazosin  4. Fall: Morphine as needed for severe pain  5. Left lower lobe collapse: Secondary to lung cancer; the patient is comfortable and not tachypneic. -chronic Oxygen saturations normal on room air.  6. DVT prophylaxis: SCDs  7. GI prophylaxis: Pepcid per home regimen  patient is a DO NOT RESUSCITATE. Will d/w dter regarding rehab   Case discussed with Care Management/Social Worker. Management plans discussed with the patient, family and they are in agreement.  CODE STATUS: dnr  DVT Prophylaxis: SCD  TOTAL TIME TAKING CARE OF THIS PATIENT: *30* minutes.  >50% time spent on counselling and coordination of care  POSSIBLE D/C IN *1-2* DAYS, DEPENDING ON CLINICAL CONDITION.  Note: This dictation was prepared with Dragon dictation along with smaller phrase technology. Any transcriptional errors that result from this process are unintentional.  Kijuana Ruppel M.D on 12/18/2016 at 12:16 PM  Between 7am to 6pm - Pager - [phone]  After 6pm go to [email or web address] - password [email or web address]  Office  [phone]  CC: Primary care physician; Marygrace Drought, MD

## 2016-12-18 NOTE — Plan of Care (Signed)
Problem: Safety: Goal: Ability to remain free from injury will improve Outcome: Progressing Pt remains on High Fall Risks; in low bed

## 2016-12-18 NOTE — Clinical Social Work Note (Signed)
Clinical Social Work Assessment  Patient Details  Name: Bradley Frederick MRN: [id number] Date of Birth: 06-Oct-1950  Date of referral:  12/18/16               Reason for consult:  Facility Placement                Permission sought to share information with:    Permission granted to share information::     Name::        Agency::     Relationship::     Contact Information:     Housing/Transportation Living arrangements for the past 2 months:  Single Family Home Source of Information:  Patient, Adult Children, Medical Team Patient Interpreter Needed:  None Criminal Activity/Legal Involvement Pertinent to Current Situation/Hospitalization:  No - Comment as needed Significant Relationships:  Adult Children Lives with:  Self Do you feel safe going back to the place where you live?  Yes Need for family participation in patient care:  No (Coment)  Care giving concerns: PT recommendation for STR   Social Worker assessment / plan:  CSW met with the patient at bedside who gave verbal permission to contact his daughter, Lorriane Shire. Lorriane Shire declined SNF when the CSW called to give her the PT recommendation citing that her father is WC bound at baseline and would not want SNF services due to terminal illness. CSW updated RN CM and is signing off. Please consult should additional needs arise.  Employment status:  Retired Forensic scientist:  Medicare PT Recommendations:  Magazine / Referral to community resources:     Patient/Family's Response to care:  The family thanked the CSW.  Patient/Family's Understanding of and Emotional Response to Diagnosis, Current Treatment, and Prognosis:  The family is aware of the patient's palliative care needs and do not agree with the PT recommendation for STR.  Emotional Assessment Appearance:  Appears older than stated age Attitude/Demeanor/Rapport:  Lethargic Affect (typically observed):  Accepting, Appropriate,  Pleasant Orientation:  Oriented to Self, Oriented to Situation, Oriented to Place Alcohol / Substance use:  Never Used Psych involvement (Current and /or in the community):  No (Comment)  Discharge Needs  Concerns to be addressed:  Care Coordination, Discharge Planning Concerns Readmission within the last 30 days:  Yes Current discharge risk:  Chronically ill Barriers to Discharge:  Continued Medical Work up   [employer], LCSW 12/18/2016, 2:30 PM

## 2016-12-18 NOTE — Progress Notes (Signed)
[facility] NAME: Bradley Frederick    MR#:  [id number]  DATE OF BIRTH:  February 01, 1951  SUBJECTIVE:  Came in with increasing weakness and fall at home Very weak Pt denies any complaints  REVIEW OF SYSTEMS:   Review of Systems  Constitutional: Negative for chills, fever and weight loss.  HENT: Negative for ear discharge, ear pain and nosebleeds.   Eyes: Negative for blurred vision, pain and discharge.  Respiratory: Negative for sputum production, shortness of breath, wheezing and stridor.   Cardiovascular: Negative for chest pain, palpitations, orthopnea and PND.  Gastrointestinal: Negative for abdominal pain, diarrhea, nausea and vomiting.  Genitourinary: Negative for frequency and urgency.  Musculoskeletal: Negative for back pain and joint pain.  Neurological: Positive for weakness. Negative for sensory change, speech change and focal weakness.  Psychiatric/Behavioral: Negative for depression and hallucinations. The patient is not nervous/anxious.    Tolerating Diet:some Tolerating PT: Rehab  DRUG ALLERGIES:   Allergies  Allergen Reactions  . Penicillins     Childhood reaction unknown reaction  .Has patient had a PCN reaction causing immediate rash, facial/tongue/throat swelling, SOB or lightheadedness with hypotension: Unknown Has patient had a PCN reaction causing severe rash involving mucus membranes or skin necrosis: Unknown Has patient had a PCN reaction that required hospitalization: Unknown Has patient had a PCN reaction occurring within the last 10 years: Unknown If all of the above answers are "NO", then may proceed with Cephalosporin use.     VITALS:  Blood pressure (!) 111/52, pulse 77, temperature 98 F (36.7 C), resp. rate 20, height 5\' 4"  (1.626 m), weight 66.2 kg (146 lb), SpO2 98 %.  PHYSICAL EXAMINATION:   Physical Exam  GENERAL:  66 y.o.-year-old patient lying in the bed with no acute distress.  Chronically ill EYES: Pupils equal, round, reactive to light and accommodation. No scleral icterus. Extraocular muscles intact.  HEENT: Head atraumatic, normocephalic. Oropharynx and nasopharynx clear.  NECK:  Supple, no jugular venous distention. No thyroid enlargement, no tenderness.  LUNGS: Normal breath sounds bilaterally, no wheezing, rales, rhonchi. No use of accessory muscles of respiration.  CARDIOVASCULAR: S1, S2 normal. No murmurs, rubs, or gallops.  ABDOMEN: Soft, nontender, nondistended. Bowel sounds present. No organomegaly or mass.  EXTREMITIES: No cyanosis, clubbing or edema b/l.    NEUROLOGIC: Cranial nerves II through XII are intact. No focal Motor or sensory deficits b/l.  weak PSYCHIATRIC:  patient is alert and oriented x 3.  SKIN: No obvious rash, lesion, or ulcer. Bruise on the shoulder, forehead  LABORATORY PANEL:  CBC  Recent Labs Lab 12/16/16 1702  WBC 11.6*  HGB 12.6*  HCT 38.9*  PLT 397    Chemistries   Recent Labs Lab 12/16/16 1702  NA 134*  K 3.5  CL 92*  CO2 31  GLUCOSE 103*  BUN 13  CREATININE 0.71  CALCIUM 9.2  AST 51*  ALT 22  ALKPHOS 90  BILITOT 1.0   Cardiac Enzymes  Recent Labs Lab 12/16/16 1702  TROPONINI 0.04*   RADIOLOGY:  Dg Chest 2 View  Result Date: 12/16/2016 CLINICAL DATA:  Lung cancer.  Cough. EXAM: CHEST  2 VIEW COMPARISON:  10/03/2016 FINDINGS: Complete collapse and opacification of the left lower lobe. This is known from PET-CT 2 days prior. Background COPD with hyperinflation. Porta catheter on the right with tip at the upper cavoatrial junction. No pneumothorax or pulmonary edema. IMPRESSION: Complete collapse and opacification of the left lower lobe as  seen on PET-CT 2 days prior. Electronically Signed   By: Monte Fantasia M.D.   On: 12/16/2016 18:12   Ct Head Wo Contrast  Result Date: 12/16/2016 CLINICAL DATA:  Fall last night.  Brain metastases. EXAM: CT HEAD WITHOUT CONTRAST CT CERVICAL SPINE WITHOUT  CONTRAST TECHNIQUE: Multidetector CT imaging of the head and cervical spine was performed following the standard protocol without intravenous contrast. Multiplanar CT image reconstructions of the cervical spine were also generated. COMPARISON:  Brain MRI from 2 days prior FINDINGS: CT HEAD FINDINGS Brain: There are known multiple high-density brain metastases. By CT 2 are seen in the right cerebellum, 3 are seen in the left frontal lobe extending from the cortex to the ependyma, 1 is seen at the left caudal thalamic groove, and 1 is seen in the superficial anterior right frontal lobe. This latter metastasis is notable for interval hemorrhage with hematoma measuring 2.8 x 2.2 x 2.3 cm. Mild rim of edema. No significant mass effect. No hydrocephalus or infarct. Vascular: Atherosclerotic calcification.  No hyperdense vessel. Skull: No acute finding. Sinuses/Orbits: No evidence of injury Other: Critical Value/emergent results were called by telephone at the time of interpretation on 12/16/2016 at 6:23 pm to Dr. Lisa Roca , who verbally acknowledged these results. CT CERVICAL SPINE FINDINGS Alignment: No traumatic malalignment. Skull base and vertebrae: Negative for fracture. Soft tissues and spinal canal: No prevertebral fluid or swelling. No visible canal hematoma. Disc levels:  Diffuse disc degeneration. Upper chest: No acute finding.  Apical emphysema. IMPRESSION: 1. Known brain metastases. An anterior right frontal metastasis has hemorrhaged since brain MRI 2 days ago. This hematoma measures up to 3 cm. 2. Negative for cervical spine fracture. Electronically Signed   By: Monte Fantasia M.D.   On: 12/16/2016 18:27   Ct Cervical Spine Wo Contrast  Result Date: 12/16/2016 CLINICAL DATA:  Fall last night.  Brain metastases. EXAM: CT HEAD WITHOUT CONTRAST CT CERVICAL SPINE WITHOUT CONTRAST TECHNIQUE: Multidetector CT imaging of the head and cervical spine was performed following the standard protocol without  intravenous contrast. Multiplanar CT image reconstructions of the cervical spine were also generated. COMPARISON:  Brain MRI from 2 days prior FINDINGS: CT HEAD FINDINGS Brain: There are known multiple high-density brain metastases. By CT 2 are seen in the right cerebellum, 3 are seen in the left frontal lobe extending from the cortex to the ependyma, 1 is seen at the left caudal thalamic groove, and 1 is seen in the superficial anterior right frontal lobe. This latter metastasis is notable for interval hemorrhage with hematoma measuring 2.8 x 2.2 x 2.3 cm. Mild rim of edema. No significant mass effect. No hydrocephalus or infarct. Vascular: Atherosclerotic calcification.  No hyperdense vessel. Skull: No acute finding. Sinuses/Orbits: No evidence of injury Other: Critical Value/emergent results were called by telephone at the time of interpretation on 12/16/2016 at 6:23 pm to Dr. Lisa Roca , who verbally acknowledged these results. CT CERVICAL SPINE FINDINGS Alignment: No traumatic malalignment. Skull base and vertebrae: Negative for fracture. Soft tissues and spinal canal: No prevertebral fluid or swelling. No visible canal hematoma. Disc levels:  Diffuse disc degeneration. Upper chest: No acute finding.  Apical emphysema. IMPRESSION: 1. Known brain metastases. An anterior right frontal metastasis has hemorrhaged since brain MRI 2 days ago. This hematoma measures up to 3 cm. 2. Negative for cervical spine fracture. Electronically Signed   By: Monte Fantasia M.D.   On: 12/16/2016 18:27   ASSESSMENT AND PLAN:   66 year old male  admitted for rhabdomyolysis. 1. Acute Rhabdo: Elevated CK. Continue to follow. Hydrate with intravenous fluid. Check CK today. Appears well hydrated  2. Lung cancer: With metastases to brain. The patient's been given dexamethasone which will continue orally as a palliative measure. - Consult palliative care. -pt has appt to get brain mapping tomorrow for Radiation.  3. BPH:  Continue finasteride and doxazosin  4. Fall: Morphine as needed for severe pain  5. Left lower lobe collapse: Secondary to lung cancer; the patient is comfortable and not tachypneic. -chronic Oxygen saturations normal on room air.  6. DVT prophylaxis: SCDs  7. GI prophylaxis: Pepcid per home regimen  patient is a DO NOT RESUSCITATE. Will d/w dter regarding rehab   Case discussed with Care Management/Social Worker. Management plans discussed with the patient, family and they are in agreement.  CODE STATUS: dnr  DVT Prophylaxis: SCD  TOTAL TIME TAKING CARE OF THIS PATIENT: *30* minutes.  >50% time spent on counselling and coordination of care  POSSIBLE D/C IN *1-2* DAYS, DEPENDING ON CLINICAL CONDITION.  Note: This dictation was prepared with Dragon dictation along with smaller phrase technology. Any transcriptional errors that result from this process are unintentional.  Barclay Lennox M.D on 12/18/2016 at 7:30 AM  Between 7am to 6pm - Pager - [phone]  After 6pm go to [email or web address] - password [email or web address] [facility]  Office  [phone]  CC: Primary care physician; Marygrace Drought, MD

## 2016-12-19 ENCOUNTER — Ambulatory Visit: Payer: Medicare Other

## 2016-12-19 DIAGNOSIS — I629 Nontraumatic intracranial hemorrhage, unspecified: Secondary | ICD-10-CM

## 2016-12-19 DIAGNOSIS — Z515 Encounter for palliative care: Secondary | ICD-10-CM

## 2016-12-19 DIAGNOSIS — C7931 Secondary malignant neoplasm of brain: Secondary | ICD-10-CM

## 2016-12-19 DIAGNOSIS — Z7189 Other specified counseling: Secondary | ICD-10-CM

## 2016-12-19 MED ORDER — MORPHINE BOLUS VIA INFUSION
1.0000 mg | INTRAVENOUS | Status: DC | PRN
  Administered 2016-12-19 – 2016-12-20 (×4): 1 mg via INTRAVENOUS
  Filled 2016-12-19: qty 1

## 2016-12-19 MED ORDER — LORAZEPAM 2 MG/ML IJ SOLN
1.0000 mg | INTRAMUSCULAR | Status: DC | PRN
  Administered 2016-12-20: 05:00:00 1 mg via INTRAVENOUS
  Filled 2016-12-19: qty 1

## 2016-12-19 MED ORDER — LORAZEPAM 2 MG/ML IJ SOLN
2.0000 mg | Freq: Once | INTRAMUSCULAR | Status: AC
  Administered 2016-12-19: 2 mg via INTRAVENOUS

## 2016-12-19 MED ORDER — LORAZEPAM 2 MG/ML IJ SOLN
INTRAMUSCULAR | Status: AC
  Administered 2016-12-19: 2 mg via INTRAVENOUS
  Filled 2016-12-19: qty 1

## 2016-12-19 MED ORDER — HALOPERIDOL LACTATE 5 MG/ML IJ SOLN
0.5000 mg | INTRAMUSCULAR | Status: DC | PRN
  Administered 2016-12-20: 0.5 mg via INTRAVENOUS
  Filled 2016-12-19: qty 1

## 2016-12-19 MED ORDER — SODIUM CHLORIDE 0.9 % IV SOLN
2.0000 mg/h | INTRAVENOUS | Status: DC
  Administered 2016-12-19: 15:00:00 1 mg/h via INTRAVENOUS
  Filled 2016-12-19: qty 10

## 2016-12-19 MED ORDER — HALOPERIDOL 0.5 MG PO TABS
0.5000 mg | ORAL_TABLET | ORAL | Status: DC | PRN
  Filled 2016-12-19: qty 1

## 2016-12-19 MED ORDER — HALOPERIDOL LACTATE 2 MG/ML PO CONC
0.5000 mg | ORAL | Status: DC | PRN
  Filled 2016-12-19: qty 0.3

## 2016-12-19 NOTE — Progress Notes (Addendum)
No charge note:   Brief note- full consult note to follow-   Lorriane Shire is step daughter and parents are divorced- she has general power of attorney but not HCPOA. Mr. Dugo has two estranged children- one son- Stephon Weathers- Lorriane Shire relayed his work phone- [phone]. Alben Spittle states he has not been involved in patient's life and does not return calls. Second daughter- has not been in contact in 72 years. However, due diligence must be made to contact patient's child before Lorriane Shire can be Media planner for healthcare decisions. Social work referral entered for due diligence.  Mariana Kaufman, AGNP-C Palliative Medicine  Please call Palliative Medicine team phone with any questions [phone]. For individual providers please see AMION.

## 2016-12-19 NOTE — Plan of Care (Signed)
Problem: Safety: Goal: Ability to remain free from injury will improve Outcome: Progressing Provided education and handout to patient regarding tele-monitoring as pt keeps pulling his IV tubing apart and causing bleeding from IV.

## 2016-12-19 NOTE — Progress Notes (Signed)
OT Cancellation Note  Patient Details Name: PEARLY APACHITO MRN: [id number] DOB: Oct 21, 1950   Cancelled Treatment:    Reason Eval/Treat Not Completed: Other (comment) (nursing request to complete OT orders). Spoke with PT and with nursing. Nursing request OT to complete OT orders, as patient is likely transitioning soon to comfort care/hospice services and is no longer appropriate for skilled rehab services. Will sign off. Please re-consult if there is a change in pt status/goals of care and if there is a need for skilled OT services.  Jeni Salles, MPH, MS, OTR/L ascom [phone] 12/19/16, 12:13 PM

## 2016-12-19 NOTE — Accreditation Note (Signed)
CSW informed of need for hospice home referral. Bradley Frederick with [facility] is working up referral. Patient expected to discharge tomorrow to hospice home.  Shela Leff MSW,LCSW [phone]

## 2016-12-19 NOTE — Progress Notes (Signed)
Nutrition Brief Note  Chart reviewed and discussed with RN. Patient now transitioning to comfort care.   No further nutrition interventions warranted at this time.  Please consult RD as needed.   Willey Blade, MS, [place], LDN Office: [phone] Pager: [phone] After Hours/Weekend Pager: [phone]

## 2016-12-19 NOTE — Consult Note (Signed)
Consultation Note Date: 12/19/2016   Patient Name: Bradley Frederick  DOB: 01-19-1951  MRN: [id number]  Age / Sex: 66 y.o., male  PCP: Marygrace Drought, MD Referring Physician: Fritzi Mandes, MD  Reason for Consultation: Establishing goals of care  HPI/Patient Profile: 66 y.o. male  with past medical history of COPD, lung cancer with brain mets, BPH, admitted on 12/16/2016 from home with fall and weakness. Workup reveals rhabdomyolysis, new 3cm hemorrhage around frontal lobe lesion, and MRI showing 10 brain metastasis. During this admission he has becoming increasingly confused and agitated, RN noted significant change yesterday with patient becoming fearful stating "I'm paralyzed", pulling and oxygen and IV tubing. Requiring IV lorazepam last night after becoming severely agitated, tore O2 tubing in half. He is now incontinent of bowel and bladder, this is change from prior to this admission. Palliative medicine consulted for [place].   Clinical Assessment and Goals of Care: Met with Lorriane Shire. Lorriane Shire tells me she is patient's step-daughter but parents are divorced. She was never legally adopted but she is only child that has been involved in his care. Patient was an alcoholic and his two other children have been estranged. One daughter has not been in contact for 14 years. There is another son- Nashaun Hillmer. She spoke with Nathaneil Canary when patient was first diagnosed, however, he has not been in contact otherwise. Lorriane Shire has [employer]- paperwork reviewed. Unfortunately, this paperwork does not cover HCPOA. However, Lorriane Shire has been patient's primary contact, caretaker, and family member for quite some time.   I have reviewed medical records including EPIC notes, labs and imaging, received report from Dr. Posey Pronto, and patient's RN, assessed the patient and then met at the bedside along with Lorriane Shire  to discuss  diagnosis prognosis, [place], EOL wishes, disposition and options.  I introduced Palliative Medicine as specialized medical care for people living with serious illness. It focuses on providing relief from the symptoms and stress of a serious illness. The goal is to improve quality of life for both the patient and the family.  As far as functional and nutritional status. He has been declining significantly over the last months. Lorriane Shire has been visiting him regularly, providing him meals, cleaning his home. He has been unable to walk or care for himself, but he has been toileting himself.   We discussed their current illness and what it means in the larger context of their on-going co-morbidities.  Natural disease trajectory and expectations at EOL were discussed. Lorriane Shire states that Friday he told her he was ready for comfort care and Hospice. She said he knew he was declining and coming to end of life, she just didn't realize it would happen so fast. I attempted to elicit values and goals of care important to the patient.   He has said he would never want to go to a nursing facility. His independence was important to him. He did not want to suffer or be in pain.  The difference between aggressive medical intervention and comfort care was considered  in light of the patient's goals of care. Lorriane Shire states comfort care would be patient's primary goal.   Advanced directives, concepts specific to code status, artifical feeding and hydration, and rehospitalization were considered and discussed. Patient has previously stated he wanted DNR status. Lorriane Shire understands that artificial feeding and hydration would not be indicated in patient's end of life situation.  Hospice and Palliative Care services outpatient were explained and offered. Lorriane Shire is interested in pursuing residential hospice placement.  Questions and concerns were addressed.  Hard Choices booklet left for review. The family was encouraged to call  with questions or concerns.   NEXT OF KIN - Unfortunately- we must first complete due diligence to contact patient's children as Lorriane Shire is not first Electrical engineer. I have called the phone number she gave me [phone]) and placed a social work consult for due diligence as well. I have asked Lorriane Shire to try and contact Doug as well.    SUMMARY OF RECOMMENDATIONS -Agitation- ?r/t pain, terminal delirium, worsening hemorrhage, dexamethasone- recommend treat agitation with morphine first, then lorazepam -GOC- Anticipate patient will be made full comfort care with referral to residential hospice- however, need to complete due diligence in attempt to contact patient's son, before transitioning to full comfort care    Code Status/Advance Care Planning:  DNR  Palliative Prophylaxis:   Frequent Pain Assessment - discussed with RN to look for nonverbal signs of pain including moving limbs, increased respirations, elevated HR  Prognosis:   <2 weeks d/t frontal hemorrhage   Discharge Planning: To Be Determined      Primary Diagnoses: Present on Admission: . Rhabdomyolysis   I have reviewed the medical record, interviewed the patient and family, and examined the patient. The following aspects are pertinent.  Past Medical History:  Diagnosis Date  . Anxiety   . Arthritis   . Brain cancer ([place])   . Carpal tunnel syndrome, bilateral   . COPD (chronic obstructive pulmonary disease) ([place])   . Depression   . Diverticulosis   . Dyspnea   . GERD (gastroesophageal reflux disease)   . Headache   . IBS (irritable bowel syndrome)   . Lung cancer ([place])   . Occasional tremors   . On home oxygen therapy    2 L/M vai nasal cannula  . Peripheral neuropathy   . Seizures ([place]) 7622   alcoholic seizure x 1  . Sleep apnea    NO C-PAP  . Vertigo    occ  . Vitamin B 12 deficiency    Social History   Social History  . Marital status: Single    Spouse name: N/A  . Number of  children: N/A  . Years of education: N/A   Social History Main Topics  . Smoking status: Current Every Day Smoker    Packs/day: 1.00    Years: 50.00    Types: Cigarettes  . Smokeless tobacco: Never Used  . Alcohol use Yes     Comment: Socially   . Drug use: No  . Sexual activity: Not Asked   Other Topics Concern  . None   Social History Narrative  . None   Family History  Problem Relation Age of Onset  . Dementia Mother   . Parkinson's disease Father   . Cancer Paternal Aunt   . Cancer Paternal Grandmother    Scheduled Meds: . dexamethasone  4 mg Oral Daily  . docusate sodium  100 mg Oral BID  . doxazosin  4 mg Oral Daily  .  DULoxetine  60 mg Oral Daily  . famotidine  20 mg Oral BID  . feeding supplement (ENSURE ENLIVE)  237 mL Oral TID BM  . finasteride  5 mg Oral Daily  . gabapentin  300 mg Oral TID  . mometasone-formoterol  2 puff Inhalation BID  . nicotine  14 mg Transdermal Daily  . sertraline  100 mg Oral Daily  . tiZANidine  4 mg Oral TID   Continuous Infusions: PRN Meds:.acetaminophen **OR** acetaminophen, ipratropium-albuterol, LORazepam, morphine injection, ondansetron **OR** ondansetron (ZOFRAN) IV, oxyCODONE Medications Prior to Admission:  Prior to Admission medications   Medication Sig Start Date End Date Taking? Authorizing Provider  albuterol (PROVENTIL HFA) 108 (90 Base) MCG/ACT inhaler Inhale 2 puffs into the lungs every 6 (six) hours as needed. 06/12/16 06/12/17 Yes [provider]  bisacodyl ([employer] GENTLE LAXATIVE) 5 MG EC tablet Take 5-10 mg by mouth daily as needed for moderate constipation (FOR CONSTIPATION).   Yes [provider]  dexamethasone (DECADRON) 4 MG tablet Take 1 tablet (4 mg total) by mouth daily. 12/15/16  Yes Chrystal, Eulas Post, MD  doxazosin (CARDURA) 4 MG tablet Take 4 mg by mouth daily. 08/31/16  Yes [provider]  DULoxetine (CYMBALTA) 60 MG capsule TAKE 1 CAPSULE BY MOUTH ONCE DAILY. 06/13/16  Yes  [provider]  finasteride (PROSCAR) 5 MG tablet Take 5 mg by mouth daily. 08/31/16  Yes [provider]  Fluticasone-Salmeterol (ADVAIR DISKUS) 250-50 MCG/DOSE AEPB Inhale 1 puff into the lungs every 12 (twelve) hours. 06/12/16 06/12/17 Yes [provider]  gabapentin (NEURONTIN) 300 MG capsule Take 300 mg by mouth 3 (three) times daily. 10/21/16  Yes [provider]  ipratropium-albuterol (DUONEB) 0.5-2.5 (3) MG/3ML SOLN Take 3 mLs by nebulization every 6 (six) hours as needed.   Yes [provider]  LORazepam (ATIVAN) 0.5 MG tablet Take 0.5 mg by mouth 2 (two) times daily. 10/21/16  Yes [provider]  oxyCODONE (OXY IR/ROXICODONE) 5 MG immediate release tablet Take 1 tablet (5 mg total) by mouth every 6 (six) hours as needed for severe pain. 12/15/16  Yes Sindy Guadeloupe, MD  ranitidine (ZANTAC) 150 MG tablet Take 150 mg by mouth 2 (two) times daily as needed (for gerd/ulcers.).  10/21/16  Yes [provider]  sertraline (ZOLOFT) 100 MG tablet Take 100 mg by mouth daily. 10/21/16  Yes [provider]  tiZANidine (ZANAFLEX) 4 MG tablet Take 4 mg by mouth 3 (three) times daily. 10/21/16  Yes [provider]  nicotine (NICODERM CQ - DOSED IN MG/24 HOURS) 14 mg/24hr patch Place 1 patch (14 mg total) onto the skin daily. Patient not taking: Reported on 12/07/2016 10/06/16   Hillary Bow, MD   Allergies  Allergen Reactions  . Penicillins     Childhood reaction unknown reaction  .Has patient had a PCN reaction causing immediate rash, facial/tongue/throat swelling, SOB or lightheadedness with hypotension: Unknown Has patient had a PCN reaction causing severe rash involving mucus membranes or skin necrosis: Unknown Has patient had a PCN reaction that required hospitalization: Unknown Has patient had a PCN reaction occurring within the last 10 years: Unknown If all of the above answers are "NO", then may proceed with  Cephalosporin use.    Review of Systems  Physical Exam  HENT:  Nasal congestion heard  Cardiovascular:  tachycardic  Pulmonary/Chest:  Increased RR  Abdominal: Soft. Bowel sounds are normal.  Incontinent of bowel  Neurological:  Does not opens eyes to verbal or tactile  stimilu, nonpurposeful movements of upper and lower limbs  Skin: He is diaphoretic.  Nursing note and vitals reviewed.   Vital Signs: BP (!) 121/52 (BP Location: Right Arm)   Pulse 92   Temp 98.4 F (36.9 C) (Oral)   Resp 20   Ht 5' 4"  (1.626 m)   Wt 66.2 kg (146 lb)   SpO2 94%   BMI 25.06 kg/m  Pain Assessment: Faces   Pain Score: 0-No pain   SpO2: SpO2: 94 % O2 Device:SpO2: 94 % O2 Flow Rate: .O2 Flow Rate (L/min): 2 L/min  IO: Intake/output summary:  Intake/Output Summary (Last 24 hours) at 12/19/16 1221 Last data filed at 12/19/16 1025  Gross per 24 hour  Intake          4859.54 ml  Output                0 ml  Net          4859.54 ml    LBM: Last BM Date: 12/16/16 Baseline Weight: Weight: 69.9 kg (154 lb) Most recent weight: Weight: 66.2 kg (146 lb)     Palliative Assessment/Data: PPS: 10%     Time In: 1000 Time Out: 1200 Time Total: 120 minutes Prolonged services billed: YES Greater than 50%  of this time was spent counseling and coordinating care related to the above assessment and plan.  Signed by: Payton Emerald, NP   Please contact Palliative Medicine Team phone at [phone] for questions and concerns.  For individual provider: See Shea Evans

## 2016-12-19 NOTE — Progress Notes (Signed)
PT Cancellation Note  Patient Details Name: Bradley Frederick MRN: [id number] DOB: Dec 07, 1950   Cancelled Treatment:    Reason Eval/Treat Not Completed: Other (comment) (Nursing requested completion of PT orders);  Per nursing patient is likely transitioning soon to comfort care/hospice services and is no longer appropriate for skilled rehab services.  Nursing requested completion of PT orders at this time.  Educated nursing that would reassess pt at a future date/time pending any change in status upon receipt of new PT orders.     Linus Salmons PT, DPT 12/19/16, 11:51 AM

## 2016-12-19 NOTE — Care Management Important Message (Signed)
Important Message  Patient Details  Name: Bradley Frederick MRN: [id number] Date of Birth: [birth year]   Medicare Important Message Given:  Yes  Given to daughter    Shelbie Ammons, RN 12/19/2016, 9:45 AM

## 2016-12-19 NOTE — Progress Notes (Signed)
New hospice home referral received from [facility]. Patient is a 66 year old man  with past medical history of COPD, stage IV NSCLC, BPH, arthritis, peripheral neuropathy and chronic pain, admitted on 12/16/2016 from home following a fall.  Workup revealed rhabdomyolysis and a new 3cm hemorrhage around frontal lobe lesion. MRI showed 10 brain leasions. Patient become increasingly agitated over the weekend and last night required IV lorazepam. Palliative medicine was consulted and met today with patient's step daughter Lorriane Shire today. She has chosen to focus on comfort with transfer to the hospice home. He was started on a  Continuous morphine drip for management of pain, currently running at 1 mg/hr. Writer met in the family room with Lorriane Shire to initiate education regarding hospice services, philosophy and team approach to care with good understanding voiced. Questions answered, consents signed. Patient information faxed to referral. Plan is for transfer tomorrow once Morphine pump is available at the hospice home. Written prescription for the continuous morphine is required, attending physician Dr. Fritzi Mandes made aware. Patient need signed DNR in place for transport. Thank you. Please also be aware that patient is donating his body to [facility] after death, should this occur at [facility], contact information has been placed in patient's chart, staff RN Robyn aware. Will follow through final disposition. Thank you. Flo Shanks RN, BSN, [facility] [place], hospital Liaison [phone]

## 2016-12-19 NOTE — Progress Notes (Signed)
[facility] at [place] NAME: Bradley Frederick    MR#:  [id number]  DATE OF BIRTH:  Jun 14, 1950  SUBJECTIVE:  Came in with increasing weakness and fall at home Patient became very confused overnight.  Code 300 was called x2. Agitated this morning.  Patient received IV Ativan 2 mg   REVIEW OF SYSTEMS:   Review of Systems  Constitutional: Negative for chills, fever and weight loss.  HENT: Negative for ear discharge, ear pain and nosebleeds.   Eyes: Negative for blurred vision, pain and discharge.  Respiratory: Negative for sputum production, shortness of breath, wheezing and stridor.   Cardiovascular: Negative for chest pain, palpitations, orthopnea and PND.  Gastrointestinal: Negative for abdominal pain, diarrhea, nausea and vomiting.  Genitourinary: Negative for frequency and urgency.  Musculoskeletal: Negative for back pain and joint pain.  Neurological: Positive for weakness. Negative for sensory change, speech change and focal weakness.  Psychiatric/Behavioral: Negative for depression and hallucinations. The patient is not nervous/anxious.     DRUG ALLERGIES:   Allergies  Allergen Reactions  . Penicillins     Childhood reaction unknown reaction  .Has patient had a PCN reaction causing immediate rash, facial/tongue/throat swelling, SOB or lightheadedness with hypotension: Unknown Has patient had a PCN reaction causing severe rash involving mucus membranes or skin necrosis: Unknown Has patient had a PCN reaction that required hospitalization: Unknown Has patient had a PCN reaction occurring within the last 10 years: Unknown If all of the above answers are "NO", then may proceed with Cephalosporin use.     VITALS:  Blood pressure (!) 121/52, pulse 92, temperature 98.4 F (36.9 C), temperature source Oral, resp. rate 20, height 5\' 4"  (1.626 m), weight 66.2 kg (146 lb), SpO2 94 %.  PHYSICAL EXAMINATION:   Physical Exam  GENERAL:   66 y.o.-year-old patient lying in the bed with no acute distress. Chronically ill EYES: Pupils equal, round, reactive to light and accommodation. No scleral icterus. Extraocular muscles intact.  HEENT: Head atraumatic, normocephalic. Oropharynx and nasopharynx clear.  NECK:  Supple, no jugular venous distention. No thyroid enlargement, no tenderness.  LUNGS: Normal breath sounds bilaterally, no wheezing, rales, rhonchi. No use of accessory muscles of respiration.  CARDIOVASCULAR: S1, S2 normal. No murmurs, rubs, or gallops.  ABDOMEN: Soft, nontender, nondistended. Bowel sounds present. No organomegaly or mass.  EXTREMITIES: No cyanosis, clubbing or edema b/l.    NEUROLOGIC: unable to assess very agitated PSYCHIATRIC: agitated  SKIN: No obvious rash, lesion, or ulcer. Bruise on the shoulder, forehead  LABORATORY PANEL:  CBC  Recent Labs Lab 12/16/16 1702  WBC 11.6*  HGB 12.6*  HCT 38.9*  PLT 397    Chemistries   Recent Labs Lab 12/16/16 1702  NA 134*  K 3.5  CL 92*  CO2 31  GLUCOSE 103*  BUN 13  CREATININE 0.71  CALCIUM 9.2  AST 51*  ALT 22  ALKPHOS 90  BILITOT 1.0   Cardiac Enzymes  Recent Labs Lab 12/16/16 1702  TROPONINI 0.04*   RADIOLOGY:  No results found. ASSESSMENT AND PLAN:   67 year old male admitted for rhabdomyolysis. 1. Acute Rhabdo: Elevated CK. -Hydrated with intravenous fluid. - Appears well hydrated -CK normal  2. Lung cancer With hemorrhagic high density metastases to brain.  -on dexamethasone which will continue as a palliative measure. - Consult palliative care. -Very agitated this morning.  Code 300 called twice.  Received Ativan for sedation at present. -Spoke with daughter Bradley Frederick.  She is on  her way to the hospital.  Palliative care to see patient today. -Spoke with patient's oncologist Dr. Janese Latavion.  She agrees patient does not have a good prognosis. -Patient has a very poor prognosis.  3. BPH:  -on finasteride and doxazosin  4.  DVT prophylaxis: SCDs   patient is a DO NOT RESUSCITATE. Patient is hospice appropriate given his current diagnosis with stage IV metastatic lung cancer  Case discussed with Care Management/Social Worker. Management plans discussed with the patient, family and they are in agreement.  CODE STATUS: dnr  DVT Prophylaxis: SCD  TOTAL TIME TAKING CARE OF THIS PATIENT: *30* minutes.  >50% time spent on counselling and coordination of care    Note: This dictation was prepared with Dragon dictation along with smaller phrase technology. Any transcriptional errors that result from this process are unintentional.  Arbie Reisz M.D on 12/19/2016 at 7:35 AM  Between 7am to 6pm - Pager - [phone]  After 6pm go to [email or web address] - password [email or web address] [facility]  Office  [phone]  CC: Primary care physician; Marygrace Drought, MD

## 2016-12-19 NOTE — Progress Notes (Signed)
No charge note:   Received call from patient's son- Annette Liotta. He has been in contact with patient's daughter. They both do not want to participate in decision making for patient and would like Lorriane Shire to be Media planner.   Will transition patient to full comfort care and refer for residential Hospice.   Mariana Kaufman, AGNP-C Palliative Medicine  Please call Palliative Medicine team phone with any questions [phone]. For individual providers please see AMION.

## 2016-12-20 DIAGNOSIS — Z809 Family history of malignant neoplasm, unspecified: Secondary | ICD-10-CM

## 2016-12-20 DIAGNOSIS — Y92019 Unspecified place in single-family (private) house as the place of occurrence of the external cause: Secondary | ICD-10-CM

## 2016-12-20 DIAGNOSIS — G4730 Sleep apnea, unspecified: Secondary | ICD-10-CM

## 2016-12-20 DIAGNOSIS — J449 Chronic obstructive pulmonary disease, unspecified: Secondary | ICD-10-CM

## 2016-12-20 DIAGNOSIS — E538 Deficiency of other specified B group vitamins: Secondary | ICD-10-CM

## 2016-12-20 DIAGNOSIS — M129 Arthropathy, unspecified: Secondary | ICD-10-CM

## 2016-12-20 DIAGNOSIS — Z8719 Personal history of other diseases of the digestive system: Secondary | ICD-10-CM

## 2016-12-20 DIAGNOSIS — Y939 Activity, unspecified: Secondary | ICD-10-CM

## 2016-12-20 DIAGNOSIS — T148XXA Other injury of unspecified body region, initial encounter: Secondary | ICD-10-CM

## 2016-12-20 DIAGNOSIS — W19XXXA Unspecified fall, initial encounter: Secondary | ICD-10-CM

## 2016-12-20 DIAGNOSIS — T796XXA Traumatic ischemia of muscle, initial encounter: Principal | ICD-10-CM

## 2016-12-20 DIAGNOSIS — C3432 Malignant neoplasm of lower lobe, left bronchus or lung: Secondary | ICD-10-CM

## 2016-12-20 DIAGNOSIS — F419 Anxiety disorder, unspecified: Secondary | ICD-10-CM

## 2016-12-20 DIAGNOSIS — K589 Irritable bowel syndrome without diarrhea: Secondary | ICD-10-CM

## 2016-12-20 DIAGNOSIS — R0602 Shortness of breath: Secondary | ICD-10-CM

## 2016-12-20 DIAGNOSIS — G40909 Epilepsy, unspecified, not intractable, without status epilepticus: Secondary | ICD-10-CM

## 2016-12-20 DIAGNOSIS — Z79899 Other long term (current) drug therapy: Secondary | ICD-10-CM

## 2016-12-20 DIAGNOSIS — G629 Polyneuropathy, unspecified: Secondary | ICD-10-CM

## 2016-12-20 DIAGNOSIS — Z515 Encounter for palliative care: Secondary | ICD-10-CM

## 2016-12-20 DIAGNOSIS — F17210 Nicotine dependence, cigarettes, uncomplicated: Secondary | ICD-10-CM

## 2016-12-20 DIAGNOSIS — K219 Gastro-esophageal reflux disease without esophagitis: Secondary | ICD-10-CM

## 2016-12-20 DIAGNOSIS — C7931 Secondary malignant neoplasm of brain: Secondary | ICD-10-CM

## 2016-12-20 DIAGNOSIS — F329 Major depressive disorder, single episode, unspecified: Secondary | ICD-10-CM

## 2016-12-20 MED ORDER — GLYCOPYRROLATE 1 MG PO TABS
1.0000 mg | ORAL_TABLET | ORAL | Status: DC | PRN
  Filled 2016-12-20: qty 1

## 2016-12-20 MED ORDER — MORPHINE BOLUS VIA INFUSION
2.0000 mg | INTRAVENOUS | Status: DC | PRN
  Filled 2016-12-20: qty 2

## 2016-12-20 MED ORDER — HALOPERIDOL LACTATE 2 MG/ML PO CONC: 0.5000 mg | ORAL | 0 refills | Status: AC | PRN

## 2016-12-20 MED ORDER — SODIUM CHLORIDE 0.9 % IV SOLN: 1.0000 mg/h | INTRAVENOUS | 0 refills | Status: AC

## 2016-12-20 MED ORDER — GLYCOPYRROLATE 0.2 MG/ML IJ SOLN
0.2000 mg | INTRAMUSCULAR | Status: DC | PRN
  Filled 2016-12-20: qty 1

## 2016-12-20 MED ORDER — GLYCOPYRROLATE 0.2 MG/ML IJ SOLN
0.2000 mg | INTRAMUSCULAR | Status: DC | PRN
  Administered 2016-12-20: 11:00:00 0.2 mg via INTRAVENOUS
  Filled 2016-12-20 (×2): qty 1

## 2016-12-20 MED ORDER — LORAZEPAM 2 MG/ML IJ SOLN
1.0000 mg | Freq: Four times a day (QID) | INTRAMUSCULAR | Status: DC
  Administered 2016-12-20: 11:00:00 1 mg via INTRAVENOUS
  Filled 2016-12-20: qty 1

## 2016-12-20 NOTE — Progress Notes (Signed)
Daily Progress Note   Patient Name: Bradley Frederick       Date:  DOB: 07-Oct-1950  Age: 66 y.o. MRN#: [id number] Attending Physician: Fritzi Mandes, MD Primary Care Physician: Marygrace Drought, MD Admit Date: 12/16/2016  Reason for Consultation/Follow-up: Establishing goals of care, Pain control and Terminal Care  Subjective: Patient dying. Had periods of agitation last night requiring haldol, lorazepam, and morphine boluses. Daughter, Lorriane Shire, at bedside. Now with terminal secretions. Awaiting transfer to residential hospice.  Review of Systems  Unable to perform ROS: Patient unresponsive    Length of Stay: 4  Current Medications: Scheduled Meds:  . LORazepam  1 mg Intravenous Q6H  . nicotine  14 mg Transdermal Daily    Continuous Infusions: . morphine 1 mg/hr (12/19/16 1459)    PRN Meds: glycopyrrolate **OR** glycopyrrolate **OR** glycopyrrolate, haloperidol **OR** haloperidol **OR** haloperidol lactate, LORazepam, morphine  Physical Exam  Constitutional: He appears well-developed and well-nourished. No distress.  Cardiovascular: Normal rate.   Neurological:  Not responding to verbal stimuli  Nursing note and vitals reviewed.           Vital Signs: BP (!) 93/44 (BP Location: Right Arm)   Pulse 97   Temp 97.9 F (36.6 C) (Oral)   Resp (!) 24   Ht 5\' 4"  (1.626 m)   Wt 66.2 kg (146 lb)   SpO2 (!) 88%   BMI 25.06 kg/m  SpO2: SpO2: (!) 88 % O2 Device: O2 Device: Nasal Cannula O2 Flow Rate: O2 Flow Rate (L/min): 2 L/min  Intake/output summary:  Intake/Output Summary (Last 24 hours) at  1045 Last data filed at  0548  Gross per 24 hour  Intake            14.82 ml  Output                0 ml  Net            14.82 ml   LBM: Last BM Date:  12/16/16 Baseline Weight: Weight: 69.9 kg (154 lb) Most recent weight: Weight: 66.2 kg (146 lb)       Palliative Assessment/Data: PPS: 10%     Patient Active Problem List   Diagnosis Date Noted  . Brain metastasis ([place])   . Intracranial bleed ([place])   . Advance care planning   .  Palliative care by specialist   . Rhabdomyolysis 12/16/2016  . Neoplasm related pain 12/15/2016  . Therapeutic opioid induced constipation 12/15/2016  . Malignant neoplasm of unspecified part of unspecified bronchus or lung ([place]) 12/09/2016  . Lung mass   . Acute respiratory failure with hypoxia ([place])   . Palliative care encounter   . Goals of care, counseling/discussion   . COPD exacerbation ([place]) 10/02/2016    Palliative Care Assessment & Plan   Patient Profile:  66 y.o. male  with past medical history of COPD, lung cancer with brain mets, BPH, admitted on 12/16/2016 from home with fall and weakness. Workup reveals rhabdomyolysis, new 3cm hemorrhage around frontal lobe lesion, and MRI showing 10 brain metastasis. During this admission he has becoming increasingly confused and agitated, RN noted significant change yesterday with patient becoming fearful stating "I'm paralyzed", pulling and oxygen and IV tubing. Requiring IV lorazepam last night after becoming severely agitated, tore O2 tubing in half. He is now incontinent of bowel and bladder, this is change from prior to this admission. Palliative medicine consulted for [name].   Assessment/Recommendations/Plan   Increase morphine cont infusion to 2mg /hr and bolus to 2mg  q 15 min based on last 24 hr requirments  Schedule lorazepam 1mg  IV q 6hr  Robinul IV 0.2mg  q4hr prn  Goals of Care and Additional Recommendations:  Limitations on Scope of Treatment: Full Comfort Care  Code Status:  DNR  Prognosis:   Hours - Days  Discharge Planning:  Hospice facility  Care plan was discussed with patient's daughter, Velna Hatchet, RN, and Santiago Glad, RN.    Thank you for allowing the Palliative Medicine Team to assist in the care of this patient.   Time In: 1015 Time Out: 1050 Total Time 35 mins Prolonged Time Billed No      Greater than 50%  of this time was spent counseling and coordinating care related to the above assessment and plan.  Mariana Kaufman, AGNP-C Palliative Medicine   Please contact Palliative Medicine Team phone at [phone] for questions and concerns.

## 2016-12-20 NOTE — Progress Notes (Signed)
EMS notified for transport. Staff RN Velna Hatchet and patient's daughter Lorriane Shire notified. Awaiting transport. Patient appears stable for transport at this time. Thank you. Flo Shanks RN, BSN, [facility] [place], hospital Liaison [phone]

## 2016-12-20 NOTE — Clinical Social Work Note (Signed)
Patient to be d/c'ed today to [facility]  Patient and family agreeable to plans will transport via ems hospice RN will call report.  Evette Cristal, MSW, [place]

## 2016-12-20 NOTE — Progress Notes (Signed)
Hematology/Oncology Consult note [facility]  [phone] [phone]  Patient Care Team: Marygrace Drought, MD as PCP - General (Family Medicine) Telford Nab, RN as Registered Nurse   Name of the patient: Bradley [place]  [id number]  [birth year]   Date of visit:    Interval history- patient resting comfortably. He is unable to wake up and answer questions    Review of systems- Review of Systems  Unable to perform ROS: Other   Patient unresponsive  Allergies  Allergen Reactions  . Penicillins     Childhood reaction unknown reaction  .Has patient had a PCN reaction causing immediate rash, facial/tongue/throat swelling, SOB or lightheadedness with hypotension: Unknown Has patient had a PCN reaction causing severe rash involving mucus membranes or skin necrosis: Unknown Has patient had a PCN reaction that required hospitalization: Unknown Has patient had a PCN reaction occurring within the last 10 years: Unknown If all of the above answers are "NO", then may proceed with Cephalosporin use.      Past Medical History:  Diagnosis Date  . Anxiety   . Arthritis   . Brain cancer ([place])   . Carpal tunnel syndrome, bilateral   . COPD (chronic obstructive pulmonary disease) ([place])   . Depression   . Diverticulosis   . Dyspnea   . GERD (gastroesophageal reflux disease)   . Headache   . IBS (irritable bowel syndrome)   . Lung cancer ([place])   . Occasional tremors   . On home oxygen therapy    2 L/M vai nasal cannula  . Peripheral neuropathy   . 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 Sleep apnea    NO C-PAP  . Vertigo    occ  . Vitamin B 12 deficiency      Past Surgical History:  Procedure Laterality Date  . BACK SURGERY     Lumbat Laminectomy  . BACK SURGERY     Spinal Fusion  . ENDOBRONCHIAL ULTRASOUND N/A 12/01/2016   Procedure: ENDOBRONCHIAL ULTRASOUND;  Surgeon: [name], MD;  Location: [facility]  ORS;  Service: Cardiopulmonary;  Laterality: N/A;  . IR FLUORO GUIDE PORT INSERTION RIGHT  12/15/2016  . TONSILLECTOMY      Social History   Social History  . Marital status: Single    Spouse name: N/A  . Number of children: N/A  . Years of education: N/A   Occupational History  . Not on file.   Social History Main Topics  . Smoking status: Current Every Day Smoker    Packs/day: 1.00    Years: 50.00    Types: Cigarettes  . Smokeless tobacco: Never Used  . Alcohol use Yes     Comment: Socially   . Drug use: No  . Sexual activity: Not on file   Other Topics Concern  . Not on file   Social History Narrative  . No narrative on file    Family History  Problem Relation Age of Onset  . Dementia Mother   . Parkinson's disease Father   . Cancer Paternal Aunt   . Cancer Paternal Grandmother      Current Facility-Administered Medications:  .  glycopyrrolate (ROBINUL) tablet 1 mg, 1 mg, Oral, Q4H PRN **OR** glycopyrrolate (ROBINUL) injection 0.2 mg, 0.2 mg, Subcutaneous, Q4H PRN **OR** glycopyrrolate (ROBINUL) injection 0.2 mg, 0.2 mg, Intravenous, Q4H PRN, Juanda Crumble, Kasie J, NP, 0.2 mg at  1102 .  haloperidol (HALDOL) tablet 0.5 mg, 0.5 mg, Oral, Q4H PRN **  OR** haloperidol (HALDOL) 2 MG/ML solution 0.5 mg, 0.5 mg, Sublingual, Q4H PRN **OR** haloperidol lactate (HALDOL) injection 0.5 mg, 0.5 mg, Intravenous, Q4H PRN, Earlie Counts, NP, 0.5 mg at  0851 .  LORazepam (ATIVAN) injection 1 mg, 1 mg, Intravenous, Q4H PRN, Fritzi Mandes, MD, 1 mg at  0512 .  LORazepam (ATIVAN) injection 1 mg, 1 mg, Intravenous, Q6H, Mahan, Kasie J, NP, 1 mg at  1055 .  morphine 250 mg in sodium chloride 0.9 % 250 mL (1 mg/mL) infusion, 2 mg/hr, Intravenous, Continuous, Mahan, Kasie J, NP, Last Rate: 2 mL/hr at  1104, 2 mg/hr at  1104 .  morphine bolus via infusion 2 mg, 2 mg, Intravenous, Q15 min PRN, Mahan, Wayna Chalet, NP .  nicotine (NICODERM CQ - dosed in  mg/24 hours) patch 14 mg, 14 mg, Transdermal, Daily, Harrie Foreman, MD, 14 mg at 12/19/16 0756  Physical exam:  Vitals:   12/18/16 0500 12/18/16 1931 12/19/16 1407  0544  BP:  (!) 121/52  (!) 93/44  Pulse:  92  97  Resp:    (!) 24  Temp:  98.4 F (36.9 C)  97.9 F (36.6 C)  TempSrc:  Oral  Oral  SpO2:  94% 94% (!) 88%  Weight: 146 lb (66.2 kg)     Height:       Physical Exam  Constitutional:  Eyes closed. Resting comfortably  Pulmonary/Chest:  Respirations regular     CMP Latest Ref Rng & Units 12/16/2016  Glucose 65 - 99 mg/dL 103(H)  BUN 6 - 20 mg/dL 13  Creatinine 0.61 - 1.24 mg/dL 0.71  Sodium 135 - 145 mmol/L 134(L)  Potassium 3.5 - 5.1 mmol/L 3.5  Chloride 101 - 111 mmol/L 92(L)  CO2 22 - 32 mmol/L 31  Calcium 8.9 - 10.3 mg/dL 9.2  Total Protein 6.5 - 8.1 g/dL 7.5  Total Bilirubin 0.3 - 1.2 mg/dL 1.0  Alkaline Phos 38 - 126 U/L 90  AST 15 - 41 U/L 51(H)  ALT 17 - 63 U/L 22   CBC Latest Ref Rng & Units 12/16/2016  WBC 3.8 - 10.6 K/uL 11.6(H)  Hemoglobin 13.0 - 18.0 g/dL 12.6(L)  Hematocrit 40.0 - 52.0 % 38.9(L)  Platelets 150 - 440 K/uL 397    _0 @  Dg Chest 2 View  Result Date: 12/16/2016 CLINICAL DATA:  Lung cancer.  Cough. EXAM: CHEST  2 VIEW COMPARISON:  10/03/2016 FINDINGS: Complete collapse and opacification of the left lower lobe. This is known from PET-CT 2 days prior. Background COPD with hyperinflation. Porta catheter on the right with tip at the upper cavoatrial junction. No pneumothorax or pulmonary edema. IMPRESSION: Complete collapse and opacification of the left lower lobe as seen on PET-CT 2 days prior. Electronically Signed   By: Monte Fantasia M.D.   On: 12/16/2016 18:12   Ct Head Wo Contrast  Result Date: 12/16/2016 CLINICAL DATA:  Fall last night.  Brain metastases. EXAM: CT HEAD WITHOUT CONTRAST CT CERVICAL SPINE WITHOUT CONTRAST TECHNIQUE: Multidetector CT imaging of the head and cervical spine was performed following  the standard protocol without intravenous contrast. Multiplanar CT image reconstructions of the cervical spine were also generated. COMPARISON:  Brain MRI from 2 days prior FINDINGS: CT HEAD FINDINGS Brain: There are known multiple high-density brain metastases. By CT 2 are seen in the right cerebellum, 3 are seen in the left frontal lobe extending from the cortex to the ependyma, 1 is seen at the left caudal thalamic groove, and  1 is seen in the superficial anterior right frontal lobe. This latter metastasis is notable for interval hemorrhage with hematoma measuring 2.8 x 2.2 x 2.3 cm. Mild rim of edema. No significant mass effect. No hydrocephalus or infarct. Vascular: Atherosclerotic calcification.  No hyperdense vessel. Skull: No acute finding. Sinuses/Orbits: No evidence of injury Other: Critical Value/emergent results were called by telephone at the time of interpretation on 12/16/2016 at 6:23 pm to Dr. Lisa Roca , who verbally acknowledged these results. CT CERVICAL SPINE FINDINGS Alignment: No traumatic malalignment. Skull base and vertebrae: Negative for fracture. Soft tissues and spinal canal: No prevertebral fluid or swelling. No visible canal hematoma. Disc levels:  Diffuse disc degeneration. Upper chest: No acute finding.  Apical emphysema. IMPRESSION: 1. Known brain metastases. An anterior right frontal metastasis has hemorrhaged since brain MRI 2 days ago. This hematoma measures up to 3 cm. 2. Negative for cervical spine fracture. Electronically Signed   By: Monte Fantasia M.D.   On: 12/16/2016 18:27   Ct Cervical Spine Wo Contrast  Result Date: 12/16/2016 CLINICAL DATA:  Fall last night.  Brain metastases. EXAM: CT HEAD WITHOUT CONTRAST CT CERVICAL SPINE WITHOUT CONTRAST TECHNIQUE: Multidetector CT imaging of the head and cervical spine was performed following the standard protocol without intravenous contrast. Multiplanar CT image reconstructions of the cervical spine were also generated.  COMPARISON:  Brain MRI from 2 days prior FINDINGS: CT HEAD FINDINGS Brain: There are known multiple high-density brain metastases. By CT 2 are seen in the right cerebellum, 3 are seen in the left frontal lobe extending from the cortex to the ependyma, 1 is seen at the left caudal thalamic groove, and 1 is seen in the superficial anterior right frontal lobe. This latter metastasis is notable for interval hemorrhage with hematoma measuring 2.8 x 2.2 x 2.3 cm. Mild rim of edema. No significant mass effect. No hydrocephalus or infarct. Vascular: Atherosclerotic calcification.  No hyperdense vessel. Skull: No acute finding. Sinuses/Orbits: No evidence of injury Other: Critical Value/emergent results were called by telephone at the time of interpretation on 12/16/2016 at 6:23 pm to Dr. Lisa Roca , who verbally acknowledged these results. CT CERVICAL SPINE FINDINGS Alignment: No traumatic malalignment. Skull base and vertebrae: Negative for fracture. Soft tissues and spinal canal: No prevertebral fluid or swelling. No visible canal hematoma. Disc levels:  Diffuse disc degeneration. Upper chest: No acute finding.  Apical emphysema. IMPRESSION: 1. Known brain metastases. An anterior right frontal metastasis has hemorrhaged since brain MRI 2 days ago. This hematoma measures up to 3 cm. 2. Negative for cervical spine fracture. Electronically Signed   By: Monte Fantasia M.D.   On: 12/16/2016 18:27   Mr Jeri Cos KC Contrast  Addendum Date: 12/17/2016   ADDENDUM REPORT: 12/17/2016 13:40 ADDENDUM: In addition to the 10 lesions originally reported and annotated with arrows, there is a 3 mm subtly enhancing right cerebellar lesion at the level of the middle cerebellar peduncle (series 14, image 52, more conspicuous on the T2* gradient series 11, image 8). There may also be a lesion along the left caudothalamic groove (series 11, image 13 and series 16, image 16). Electronically Signed   By: Logan Bores M.D.   On: 12/17/2016  13:40   Result Date: 12/17/2016 CLINICAL DATA:  Non-small cell lung cancer.  Staging. EXAM: MRI HEAD WITHOUT AND WITH CONTRAST TECHNIQUE: Multiplanar, multiecho pulse sequences of the brain and surrounding structures were obtained without and with intravenous contrast. CONTRAST:  79m MULTIHANCE GADOBENATE DIMEGLUMINE 529  MG/ML IV SOLN COMPARISON:  12/14/2016 PET-CT. FINDINGS: Brain: There is no evidence of acute infarct, midline shift, or extra-axial fluid collection. Mild generalized cerebral atrophy is within normal limits for age. Scattered small foci of T2 hyperintensity in the cerebral white matter and pons are nonspecific but compatible with minimal chronic small vessel ischemic disease. There are multiple enhancing brain lesions, many of which are hemorrhagic. Two lesions are present in the right cerebellum, the larger measuring 10 mm (series 14, image 46). There are 8 supratentorial lesions, with the largest measuring 15 mm in the left middle frontal gyrus (series 14, image 132). There is mild edema associated with the larger cerebellar lesion and with a few of the cerebral lesions, without significant mass effect. Vascular: Major intracranial vascular flow voids are preserved. Skull and upper cervical spine: No suspicious marrow lesion. Moderate cervical disc degeneration. Sinuses/Orbits: Unremarkable orbits. Paranasal sinuses and mastoid air cells are clear. Other: 1.4 cm mass within or immediately posterior to the left parotid gland, hypermetabolic on today's PET-CT. Smaller 6 mm nodule more inferiorly along the posterior left parotid gland. IMPRESSION: 1. Ten hemorrhagic brain lesions consistent with metastases. At most mild edema without mass effect. 2. 1.4 cm left parotid/periparotid mass which may reflect primary parotid neoplasm or possibly metastatic disease. Electronically Signed: By: Logan Bores M.D. On: 12/14/2016 10:24   Nm Pet Image Initial (pi) Skull Base To Thigh  Result Date:  12/14/2016 CLINICAL DATA:  Initial treatment strategy for non-small-cell lung cancer. EXAM: NUCLEAR MEDICINE PET SKULL BASE TO THIGH TECHNIQUE: 12.8 mCi F-18 FDG was injected intravenously. Full-ring PET imaging was performed from the skull base to thigh after the radiotracer. CT data was obtained and used for attenuation correction and anatomic localization. FASTING BLOOD GLUCOSE:  Value: 98 mg/dl COMPARISON:  CT scan 10/02/2016 FINDINGS: NECK: Hypermetabolic nodule identified along or in the posterior aspect the left parotid gland measures 7 mm short axis with SUV max = 5.7 CHEST: Left hilar lymphadenopathy is markedly hypermetabolic with SUV max = 42.6. The confluent soft tissue attenuation in the left lower lobe is diffusely hypermetabolic with SUV max = 13. There is hypermetabolic some parietal lymphadenopathy. Hypermetabolic AP window lymphadenopathy demonstrates SUV max = 14.4. Several hypermetabolic subcutaneous nodules are identified in the chest. 15 mm left lateral chest wall nodule in the mid axillary line (image 117 series 4) is hypermetabolic with SUV max = 9.5. Hypermetabolic sub xiphoid nodule in the anterior midline (image 139) demonstrates SUV max = 12.3. Tiny subcutaneous nodules in the posterior left upper back (image 35 series 4) are hypermetabolic. ABDOMEN/PELVIS: 10 mm exophytic lesion upper pole left kidney is hypermetabolic with SUV max = 5.2. Tiny calcified gallstones are evident. There is abdominal aortic atherosclerosis without aneurysm. SKELETON: No focal hypermetabolic activity to suggest skeletal metastasis. IMPRESSION: 1. Marked hypermetabolism in the left lower lobe with hypermetabolic lymphadenopathy in the left hilum and mediastinum. No hypermetabolism in the right hilum on today's study. 2. Several hypermetabolic subcutaneous nodules in the chest suggest metastatic involvement. 3. Hypermetabolic soft tissue nodule adjacent to or in the posterior aspect of the left parotid gland  may represent metastatic disease or primary parotid neoplasm. 4. 10 mm exophytic lesion upper pole left kidney demonstrates hypermetabolism. Renal cell carcinoma could have this appearance. Abdominal MRI without and with contrast would be the study of choice to further evaluate. 5.  Aortic Atherosclerois (ICD10-170.0) Electronically Signed   By: Misty Stanley M.D.   On: 12/14/2016 09:54   Ir Fluoro Guide  Port Insertion Right  Result Date: 12/15/2016 CLINICAL DATA:  Non-small-cell lung carcinoma of left perihilar region with metastatic disease to multiple sites. Port-A-Cath needed for chemotherapy. EXAM: IMPLANTED PORT A CATH PLACEMENT WITH ULTRASOUND AND FLUOROSCOPIC GUIDANCE ANESTHESIA/SEDATION: 2.0 mg IV Versed; 100 mcg IV Fentanyl Total Moderate Sedation Time:  30 minutes The patient's level of consciousness and physiologic status were continuously monitored during the procedure by Radiology nursing. Additional Medications: 1 g IV vancomycin. FLUOROSCOPY TIME:  24 seconds.  3.0 mGy. PROCEDURE: The procedure, risks, benefits, and alternatives were explained to the patient. Questions regarding the procedure were encouraged and answered. The patient understands and consents to the procedure. A time-out was performed prior to initiating the procedure. Ultrasound was utilized to confirm patency of the right internal jugular vein. The right neck and chest were prepped with chlorhexidine in a sterile fashion, and a sterile drape was applied covering the operative field. Maximum barrier sterile technique with sterile gowns and gloves were used for the procedure. Local anesthesia was provided with 1% lidocaine. After creating a small venotomy incision, a 21 gauge needle was advanced into the right internal jugular vein under direct, real-time ultrasound guidance. Ultrasound image documentation was performed. After securing guidewire access, an 8 Fr dilator was placed. A J-wire was kinked to measure appropriate  catheter length. A subcutaneous port pocket was then created along the upper chest wall utilizing sharp and blunt dissection. Portable cautery was utilized. The pocket was irrigated with sterile saline. A single lumen power injectable port was chosen for placement. The 8 Fr catheter was tunneled from the port pocket site to the venotomy incision. The port was placed in the pocket. External catheter was trimmed to appropriate length based on guidewire measurement. At the venotomy, an 8 Fr peel-away sheath was placed over a guidewire. The catheter was then placed through the sheath and the sheath removed. Final catheter positioning was confirmed and documented with a fluoroscopic spot image. The port was accessed with a needle and aspirated and flushed with heparinized saline. The access needle was removed. The venotomy and port pocket incisions were closed with subcutaneous 3-0 Monocryl and subcuticular 4-0 Vicryl. Dermabond was applied to both incisions. COMPLICATIONS: COMPLICATIONS None FINDINGS: After catheter placement, the tip lies at the cavo-atrial junction. The catheter aspirates normally and is ready for immediate use. IMPRESSION: Placement of single lumen port a cath via right internal jugular vein. The catheter tip lies at the cavo-atrial junction. A power injectable port a cath was placed and is ready for immediate use. Electronically Signed   By: Aletta Edouard M.D.   On: 12/15/2016 15:41     Assessment and plan- Patient is a 66 y.o. male with newly diagnosed NSCLC with brain mets presenting with altered mental status post fall at home  I met with patients step daughter vanessa in the hallway. She is satisfied with his care and that he is appearing comfortable and not agitated. She is on board with the hospice home discharge plan but understands that if he is imminently dying, he willl not be transported to hospice home. Patients ex wife also met with him at the beside yesterday and he recognized  her and could communicate  Appreciate help from Dr. Posey Pronto and Palliative care in transitioning him to comfort care and hospice home   Visit Diagnosis 1. Multiple bruises   2. Fall in home, initial encounter   3. Brain metastasis ([place])   4. Intracranial bleed ([place])   5. Traumatic rhabdomyolysis, initial encounter (  [place])      Dr. Randa Evens, MD, MPH [place] [facility] Pager- [phone]  1:15 PM

## 2016-12-20 NOTE — Progress Notes (Signed)
Follow up visit made to new hospice home referral. Patient seen lying in bed, eyes closed, daughter Lorriane Shire at bedside. Per Lorriane Shire and chart note review patient had a very upsetting period of agitation last night and required both IV haldol, lorazepam and morphine bolus'. Patient with audible secretions, Palliaitve Medicine NP Mariana Kaufman made aware and placed new orders and Staff RN Velna Hatchet made aware. Mouth care provided, patient also changed and repositioned with staff RN Velna Hatchet. Patient did become notably more animated/agitated with movement/care and required a PRN dose of lorazepam as well as a morphine bolus. Feet and hands cool to touch with some mottling. Education and emotional support given to [place]. Written prescription for continuous morphine drip faxed to hospice triage. Report called to the hospice home. Awaiting morphine pump to be delivered to the hospice home prior to calling EMS. Hospital Staff and Lorriane Shire aware. Will continue to follow and assess stability for transport. Thank you. Flo Shanks RN, BSN, [facility] [place], hospital liaison [phone]

## 2016-12-20 NOTE — Discharge Summary (Signed)
[facility] [place] NAME: Bradley Frederick    MR#:  [id number]  DATE OF BIRTH:  [birth year]  DATE OF ADMISSION:  12/16/2016 ADMITTING PHYSICIAN: Harrie Foreman, MD  DATE OF DISCHARGE:   PRIMARY CARE PHYSICIAN: Marygrace Drought, MD    ADMISSION DIAGNOSIS:  Brain metastasis ([place]) [C79.31] Multiple bruises [T07.XXXA] Intracranial bleed ([place]) [I62.9] Fall in home, initial encounter [W19.XXXA, Y92.009] Traumatic rhabdomyolysis, initial encounter ([place]) [T79.6XXA]  DISCHARGE DIAGNOSIS:  Failure to thrive secondary to metastatic lung cancer Altered mental status secondary to high intensity metastatic brain lesions  SECONDARY DIAGNOSIS:   Past Medical History:  Diagnosis Date  . Anxiety   . Arthritis   . Brain cancer ([place])   . Carpal tunnel syndrome, bilateral   . COPD (chronic obstructive pulmonary disease) ([place])   . Depression   . Diverticulosis   . Dyspnea   . GERD (gastroesophageal reflux disease)   . Headache   . IBS (irritable bowel syndrome)   . Lung cancer ([place])   . Occasional tremors   . On home oxygen therapy    2 L/M vai nasal cannula  . Peripheral neuropathy   . Seizures ([place]) 8546   alcoholic seizure x 1  . Sleep apnea    NO C-PAP  . Vertigo    occ  . Vitamin B 12 deficiency     HOSPITAL COURSE:   66 year old male admitted for rhabdomyolysis. 1. Acute Rhabdo: Elevated CK. -Hydrated with intravenous fluid.  2. Lung cancer With hemorrhagic high density metastases to brain.  -Patient was on on dexamethasone - Consult palliative care--appreciated.  They met with family and they are in agreement with hospice home.  Patient has poor prognosis -Spoke with daughter Lorriane Shire.    3.  Altered mental status/encephalopathy secondary to high intensity metastatic/hemorrhagic lesions in the brain -Patient currently is on morphine drip and will be discharged on it to the hospice facility today  4. DVT  prophylaxis: SCDs  DC to hospice home CONSULTS OBTAINED:  Treatment Team:  Sindy Guadeloupe, MD  DRUG ALLERGIES:   Allergies  Allergen Reactions  . Penicillins     Childhood reaction unknown reaction  .Has patient had a PCN reaction causing immediate rash, facial/tongue/throat swelling, SOB or lightheadedness with hypotension: Unknown Has patient had a PCN reaction causing severe rash involving mucus membranes or skin necrosis: Unknown Has patient had a PCN reaction that required hospitalization: Unknown Has patient had a PCN reaction occurring within the last 10 years: Unknown If all of the above answers are "NO", then may proceed with Cephalosporin use.     DISCHARGE MEDICATIONS:   Current Discharge Medication List    START taking these medications   Details  haloperidol (HALDOL) 2 MG/ML solution Place 0.3 mLs (0.6 mg total) under the tongue every 4 (four) hours as needed for agitation (or delirium). Qty: 5 mL, Refills: 0    morphine 250 mg in sodium chloride 0.9 % 250 mL Inject 1 mg/hr into the vein continuous. Qty: 1 ampule, Refills: 0      CONTINUE these medications which have NOT CHANGED   Details  LORazepam (ATIVAN) 0.5 MG tablet Take 0.5 mg by mouth 2 (two) times daily.      STOP taking these medications     albuterol (PROVENTIL HFA) 108 (90 Base) MCG/ACT inhaler      bisacodyl ([employer] GENTLE LAXATIVE) 5 MG EC tablet      dexamethasone (DECADRON) 4 MG tablet  doxazosin (CARDURA) 4 MG tablet      DULoxetine (CYMBALTA) 60 MG capsule      finasteride (PROSCAR) 5 MG tablet      Fluticasone-Salmeterol (ADVAIR DISKUS) 250-50 MCG/DOSE AEPB      gabapentin (NEURONTIN) 300 MG capsule      ipratropium-albuterol (DUONEB) 0.5-2.5 (3) MG/3ML SOLN      oxyCODONE (OXY IR/ROXICODONE) 5 MG immediate release tablet      ranitidine (ZANTAC) 150 MG tablet      sertraline (ZOLOFT) 100 MG tablet      tiZANidine (ZANAFLEX) 4 MG tablet      nicotine (NICODERM CQ -  DOSED IN MG/24 HOURS) 14 mg/24hr patch         If you experience worsening of your admission symptoms, develop shortness of breath, life threatening emergency, suicidal or homicidal thoughts you must seek medical attention immediately by calling 911 or calling your MD immediately  if symptoms less severe.  You Must read complete instructions/literature along with all the possible adverse reactions/side effects for all the Medicines you take and that have been prescribed to you. Take any new Medicines after you have completely understood and accept all the possible adverse reactions/side effects.   Please note  You were cared for by a hospitalist during your hospital stay. If you have any questions about your discharge medications or the care you received while you were in the hospital after you are discharged, you can call the unit and asked to speak with the hospitalist on call if the hospitalist that took care of you is not available. Once you are discharged, your primary care physician will handle any further medical issues. Please note that NO REFILLS for any discharge medications will be authorized once you are discharged, as it is imperative that you return to your primary care physician (or establish a relationship with a primary care physician if you do not have one) for your aftercare needs so that they can reassess your need for medications and monitor your lab values. Today   SUBJECTIVE    resting VITAL SIGNS:  Blood pressure (!) 93/44, pulse 97, temperature 97.9 F (36.6 C), temperature source Oral, resp. rate (!) 24, height 5' 4"  (1.626 m), weight 66.2 kg (146 lb), SpO2 (!) 88 %.  I/O:   Intake/Output Summary (Last 24 hours) at  0651 Last data filed at  0548  Gross per 24 hour  Intake          3021.61 ml  Output                0 ml  Net          3021.61 ml    PHYSICAL EXAMINATION:  GENERAL:  66 y.o.-year-old patient lying in the bed with no acute  distress.  EYES: Pupils equal, round, reactive to light and accommodation. No scleral icterus. Extraocular muscles intact.  HEENT: Head atraumatic, normocephalic. Oropharynx and nasopharynx clear.  NECK:  Supple, no jugular venous distention. No thyroid enlargement, no tenderness.  LUNGS: Normal breath sounds bilaterally, no wheezing, rales,rhonchi or crepitation. No use of accessory muscles of respiration.  CARDIOVASCULAR: S1, S2 normal. No murmurs, rubs, or gallops.  ABDOMEN: Soft, non-tender, non-distended. Bowel sounds present. No organomegaly or mass.  EXTREMITIES: No pedal edema, cyanosis, or clubbing.  NEUROLOGIC: comfort care PSYCHIATRIC: resting SKIN: No obvious rash, lesion, or ulcer.   DATA REVIEW:   CBC   Recent Labs Lab 12/16/16 1702  WBC 11.6*  HGB 12.6*  HCT 38.9*  PLT 397    Chemistries   Recent Labs Lab 12/16/16 1702  NA 134*  K 3.5  CL 92*  CO2 31  GLUCOSE 103*  BUN 13  CREATININE 0.71  CALCIUM 9.2  AST 51*  ALT 22  ALKPHOS 90  BILITOT 1.0    Microbiology Results   No results found for this or any previous visit (from the past 240 hour(s)).  RADIOLOGY:  No results found.   Management plans discussed with the patient, family and they are in agreement.  CODE STATUS:     Code Status Orders        Start     Ordered   12/19/16 1406  Do not attempt resuscitation (DNR)  Continuous    Question Answer Comment  In the event of cardiac or respiratory ARREST Do not call a "code blue"   In the event of cardiac or respiratory ARREST Do not perform Intubation, CPR, defibrillation or ACLS   In the event of cardiac or respiratory ARREST Use medication by any route, position, wound care, and other measures to relive pain and suffering. May use oxygen, suction and manual treatment of airway obstruction as needed for comfort.      12/19/16 1407    Code Status History    Date Active Date Inactive Code Status Order ID Comments User Context    12/16/2016 10:36 PM 12/19/2016  2:07 PM DNR [id number]  Harrie Foreman, MD Inpatient   12/15/2016 12:28 PM 12/15/2016 12:28 PM DNR [id number] Discussed with patient today Sindy Guadeloupe, MD Outpatient   12/15/2016 12:28 PM 12/16/2016  4:42 PM Partial Code [id number]  Sindy Guadeloupe, MD Outpatient   10/03/2016 11:42 AM 10/05/2016  7:37 PM DNR [id number]  Hillary Bow, MD Inpatient   10/02/2016  5:31 PM 10/03/2016 11:42 AM Full Code [id number]  Henreitta Leber, MD Inpatient    Advance Directive Documentation     Most Recent Value  Type of Advance Directive  -- [General Power of Attorney]  Pre-existing out of facility DNR order (yellow form or pink MOST form)  -  "MOST" Form in Place?  -      TOTAL TIME TAKING CARE OF THIS PATIENT:40  minutes.    Sravya Grissom M.D on  at 6:51 AM  Between 7am to 6pm - Pager - [phone] After 6pm go to [email or web address] - password [email or web address] [facility]  Office  [phone]  CC: Primary care physician; Marygrace Drought, MD

## 2016-12-20 NOTE — Progress Notes (Signed)
Patient discharged via EMS to [facility]. Santiago Glad, [facility] Liaison assisted with transfer. Prescription sent with patient.

## 2016-12-21 ENCOUNTER — Ambulatory Visit: Payer: Medicare Other

## 2016-12-22 LAB — SURGICAL PATHOLOGY

## 2016-12-22 DEATH — deceased

## 2016-12-23 ENCOUNTER — Encounter: Payer: Self-pay | Admitting: Oncology

## 2016-12-27 ENCOUNTER — Ambulatory Visit: Payer: Medicare Other | Admitting: Oncology

## 2016-12-29 ENCOUNTER — Ambulatory Visit: Payer: Medicare Other | Admitting: Oncology

## 2017-01-09 ENCOUNTER — Encounter: Payer: Self-pay | Admitting: Oncology

## 2018-03-28 IMAGING — CT NM PET TUM IMG INITIAL (PI) SKULL BASE T - THIGH
1 of 9 series · 1 of 25 positions shown · non-contrast
Comparison: CT scan 10/02/2016

CLINICAL DATA: Initial treatment strategy for non-small-cell lung
cancer.

EXAM:
NUCLEAR MEDICINE PET SKULL BASE TO THIGH
TECHNIQUE: 12.8 mCi F-18 FDG was injected intravenously. Full-ring PET imaging
was performed from the skull base to thigh after the radiotracer. CT
data was obtained and used for attenuation correction and anatomic
localization.
FASTING BLOOD GLUCOSE:  Value: 98 mg/dl

[Series 4: ct wb 5.0 b30f · axial · 5.0mm · 0.98mm/px · 1 of 290 slices shown]
[im 290/290  brain]
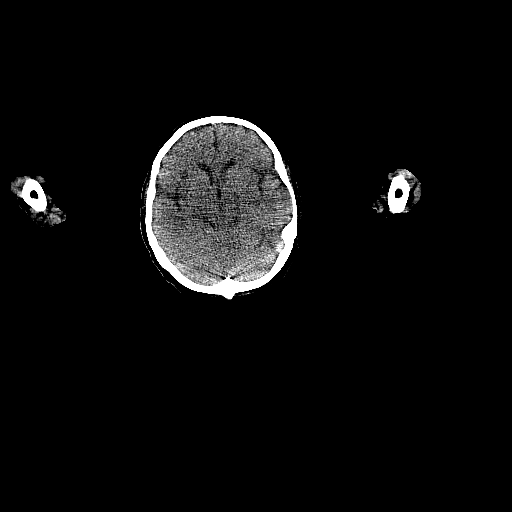

[1 of 25 positions shown; findings below may reference images not displayed]

FINDINGS: NECK: Hypermetabolic nodule identified along or in the posterior
aspect the left parotid gland measures 7 mm short axis with SUV max
=

CHEST: Left hilar lymphadenopathy is markedly hypermetabolic with
SUV max = 13.3. The confluent soft tissue attenuation in the left
lower lobe is diffusely hypermetabolic with SUV max = 13. There is
hypermetabolic some parietal lymphadenopathy. Hypermetabolic AP
window lymphadenopathy demonstrates SUV max = 14.4.

Several hypermetabolic subcutaneous nodules are identified in the
chest. 15 mm left lateral chest wall nodule in the mid axillary line
(image 117 series 4) is hypermetabolic with SUV max = 9.5.
Hypermetabolic sub xiphoid nodule in the anterior midline (image
139) demonstrates SUV max = 12.3. Tiny subcutaneous nodules in the
posterior left upper back (image 35 series 4) are hypermetabolic.

ABDOMEN/PELVIS: 10 mm exophytic lesion upper pole left kidney is
hypermetabolic with SUV max = 5.2.

Tiny calcified gallstones are evident. There is abdominal aortic
atherosclerosis without aneurysm.

SKELETON: No focal hypermetabolic activity to suggest skeletal
metastasis.
IMPRESSION: 1. Marked hypermetabolism in the left lower lobe with hypermetabolic
lymphadenopathy in the left hilum and mediastinum. No
hypermetabolism in the right hilum on today's study.
2. Several hypermetabolic subcutaneous nodules in the chest suggest
metastatic involvement.
3. Hypermetabolic soft tissue nodule adjacent to or in the posterior
aspect of the left parotid gland may represent metastatic disease or
primary parotid neoplasm.
4. 10 mm exophytic lesion upper pole left kidney demonstrates
hypermetabolism. Renal cell carcinoma could have this appearance.
Abdominal MRI without and with contrast would be the study of choice
to further evaluate.
5.  Aortic Atherosclerois ([id number])
